# Patient Record
Sex: Female | Born: 1960 | Race: Black or African American | Hispanic: No | Smoking: Never smoker
Health system: Southern US, Community
[De-identification: ages and names within clinical notes are randomized; demographics above are authoritative.]

## PROBLEM LIST (undated history)

## (undated) DIAGNOSIS — E039 Hypothyroidism, unspecified: Secondary | ICD-10-CM

## (undated) DIAGNOSIS — E7800 Pure hypercholesterolemia, unspecified: Secondary | ICD-10-CM

## (undated) DIAGNOSIS — I10 Essential (primary) hypertension: Secondary | ICD-10-CM

## (undated) HISTORY — PX: ABDOMINAL HYSTERECTOMY: SHX81

## (undated) HISTORY — PX: MYOMECTOMY: SHX85

---

## 1998-02-22 ENCOUNTER — Encounter: Payer: Self-pay | Admitting: Emergency Medicine

## 1998-02-22 ENCOUNTER — Emergency Department (HOSPITAL_COMMUNITY): Admission: EM | Admit: 1998-02-22 | Discharge: 1998-02-22 | Payer: Self-pay | Admitting: Emergency Medicine

## 1999-09-26 ENCOUNTER — Emergency Department (HOSPITAL_COMMUNITY): Admission: EM | Admit: 1999-09-26 | Discharge: 1999-09-26 | Payer: Self-pay | Admitting: Emergency Medicine

## 2000-07-14 ENCOUNTER — Emergency Department (HOSPITAL_COMMUNITY): Admission: EM | Admit: 2000-07-14 | Discharge: 2000-07-14 | Payer: Self-pay | Admitting: Emergency Medicine

## 2000-07-14 ENCOUNTER — Encounter: Payer: Self-pay | Admitting: Emergency Medicine

## 2006-02-14 ENCOUNTER — Ambulatory Visit: Payer: Self-pay | Admitting: Gynecology

## 2010-02-11 ENCOUNTER — Encounter: Payer: Self-pay | Admitting: Thoracic Surgery

## 2013-02-15 ENCOUNTER — Emergency Department (HOSPITAL_BASED_OUTPATIENT_CLINIC_OR_DEPARTMENT_OTHER)
Admission: EM | Admit: 2013-02-15 | Discharge: 2013-02-15 | Disposition: A | Discharge status: Home / Self Care | Payer: Self-pay | Attending: Emergency Medicine | Admitting: Emergency Medicine

## 2013-02-15 ENCOUNTER — Encounter (HOSPITAL_BASED_OUTPATIENT_CLINIC_OR_DEPARTMENT_OTHER): Payer: Self-pay | Admitting: Emergency Medicine

## 2013-02-15 DIAGNOSIS — I10 Essential (primary) hypertension: Secondary | ICD-10-CM | POA: Insufficient documentation

## 2013-02-15 DIAGNOSIS — E039 Hypothyroidism, unspecified: Secondary | ICD-10-CM | POA: Insufficient documentation

## 2013-02-15 DIAGNOSIS — K089 Disorder of teeth and supporting structures, unspecified: Secondary | ICD-10-CM | POA: Insufficient documentation

## 2013-02-15 DIAGNOSIS — K0889 Other specified disorders of teeth and supporting structures: Secondary | ICD-10-CM

## 2013-02-15 DIAGNOSIS — K044 Acute apical periodontitis of pulpal origin: Secondary | ICD-10-CM | POA: Insufficient documentation

## 2013-02-15 DIAGNOSIS — Z79899 Other long term (current) drug therapy: Secondary | ICD-10-CM | POA: Insufficient documentation

## 2013-02-15 DIAGNOSIS — Z791 Long term (current) use of non-steroidal anti-inflammatories (NSAID): Secondary | ICD-10-CM | POA: Insufficient documentation

## 2013-02-15 DIAGNOSIS — E7800 Pure hypercholesterolemia, unspecified: Secondary | ICD-10-CM | POA: Insufficient documentation

## 2013-02-15 DIAGNOSIS — R51 Headache: Secondary | ICD-10-CM | POA: Insufficient documentation

## 2013-02-15 DIAGNOSIS — K047 Periapical abscess without sinus: Secondary | ICD-10-CM

## 2013-02-15 DIAGNOSIS — K002 Abnormalities of size and form of teeth: Secondary | ICD-10-CM | POA: Insufficient documentation

## 2013-02-15 DIAGNOSIS — Z792 Long term (current) use of antibiotics: Secondary | ICD-10-CM | POA: Insufficient documentation

## 2013-02-15 DIAGNOSIS — K029 Dental caries, unspecified: Secondary | ICD-10-CM | POA: Insufficient documentation

## 2013-02-15 HISTORY — DX: Pure hypercholesterolemia, unspecified: E78.00

## 2013-02-15 HISTORY — DX: Hypothyroidism, unspecified: E03.9

## 2013-02-15 HISTORY — DX: Essential (primary) hypertension: I10

## 2013-02-15 MED ORDER — AMOXICILLIN 500 MG PO CAPS: 500.0000 mg | ORAL_CAPSULE | Freq: Three times a day (TID) | ORAL | Status: DC

## 2013-02-15 MED ORDER — IBUPROFEN 800 MG PO TABS: 800.0000 mg | ORAL_TABLET | Freq: Three times a day (TID) | ORAL | Status: DC

## 2013-02-15 NOTE — ED Notes (Signed)
Right upper tooth pain x 2 days.  Pt states her gums are swollen and she is having pain in jaw and right side of face.

## 2013-02-15 NOTE — ED Provider Notes (Signed)
CSN: [id number]     Arrival date & time 02/15/13  1158 History   First MD Initiated Contact with Patient 02/15/13 1214     Chief Complaint  Patient presents with  . Dental Pain  . Headache   (Consider location/radiation/quality/duration/timing/severity/associated sxs/prior Treatment) HPI Comments: Patient is a 53 year old female who presents to the emergency department complaining of right upper tooth pain x2 days. Patient states she feels like her gums are swollen. Pain radiating throughout the right side of her face, denies facial swelling. Pain worse with chewing. Denies difficulty swallowing. Denies fevers. She does not have a dentist. She has not had any alleviating factors for her symptoms.  Patient is a 53 y.o. female presenting with tooth pain and headaches. The history is provided by the patient.  Dental Pain Headache   Past Medical History  Diagnosis Date  . Hypertension   . Hypothyroid   . Hypercholesteremia    No past surgical history on file. No family history on file. History  Substance Use Topics  . Smoking status: Never Smoker   . Smokeless tobacco: Not on file  . Alcohol Use: Not on file   OB History   Grav Para Term Preterm Abortions TAB SAB Ect Mult Living                 Review of Systems  HENT: Positive for dental problem.   All other systems reviewed and are negative.    Allergies  Review of patient's allergies indicates not on file.  Home Medications   Current Outpatient Rx  Name  Route  Sig  Dispense  Refill  . albuterol (PROVENTIL HFA;VENTOLIN HFA) 108 (90 BASE) MCG/ACT inhaler   Inhalation   Inhale 2 puffs into the lungs every 6 (six) hours as needed for wheezing or shortness of breath.         Marland Kitchen amLODipine (NORVASC) 10 MG tablet   Oral   Take 10 mg by mouth daily.         . hydrochlorothiazide (HYDRODIURIL) 25 MG tablet   Oral   Take 25 mg by mouth daily.         Marland Kitchen levothyroxine (SYNTHROID, LEVOTHROID) 100 MCG tablet    Oral   Take 100 mcg by mouth daily before breakfast.         . Multiple Vitamins-Minerals (MULTIVITAMIN WITH MINERALS) tablet   Oral   Take 1 tablet by mouth daily.         . potassium chloride (K-DUR,KLOR-CON) 10 MEQ tablet   Oral   Take 10 mEq by mouth 2 (two) times daily.         . rosuvastatin (CRESTOR) 10 MG tablet   Oral   Take 10 mg by mouth daily.         Marland Kitchen amoxicillin (AMOXIL) 500 MG capsule   Oral   Take 1 capsule (500 mg total) by mouth 3 (three) times daily.   21 capsule   0   . ibuprofen (ADVIL,MOTRIN) 800 MG tablet   Oral   Take 1 tablet (800 mg total) by mouth 3 (three) times daily.   21 tablet   0    BP 138/99  Pulse 93  Temp(Src) 98.9 F (37.2 C)  Resp 16  Ht 5\' 7"  (1.702 m)  Wt 181 lb (82.101 kg)  BMI 28.34 kg/m2  SpO2 98% Physical Exam  Nursing note and vitals reviewed. Constitutional: She is oriented to person, place, and time. She appears well-developed and well-nourished. No  distress.  HENT:  Head: Normocephalic and atraumatic.  Mouth/Throat: Oropharynx is clear and moist.  Poor dentition. Multiple dental caries and tooth decay. Gingival swelling right upper. No dental abscess.  Eyes: Conjunctivae are normal.  Neck: Normal range of motion. Neck supple.  Cardiovascular: Normal rate, regular rhythm and normal heart sounds.   Pulmonary/Chest: Effort normal and breath sounds normal.  Abdominal: Soft. Bowel sounds are normal. There is no tenderness.  Musculoskeletal: Normal range of motion. She exhibits no edema.  Lymphadenopathy:    She has no cervical adenopathy.  Neurological: She is alert and oriented to person, place, and time.  Skin: Skin is warm and dry. She is not diaphoretic.  Psychiatric: She has a normal mood and affect. Her behavior is normal.    ED Course  Procedures (including critical care time) Labs Review Labs Reviewed - No data to display Imaging Review No results found.  EKG Interpretation   None        MDM   1. Pain, dental   2. Dental infection     Dental pain associated with dental infection. No evidence of dental abscess. Patient is afebrile, non toxic appearing and swallowing secretions well. I gave patient referral to dentist and stressed the importance of dental follow up for ultimate management of dental pain. I will also give amoxicillin and pain control. Patient voices understanding and is agreeable to plan.     Trevor MaceRobyn M Albert, PA-C 02/15/13 1249

## 2013-02-15 NOTE — ED Provider Notes (Signed)
Medical screening examination/treatment/procedure(s) were performed by non-physician practitioner and as supervising physician I was immediately available for consultation/collaboration.  EKG Interpretation   None         Charles B. Sheldon, MD 02/15/13 1324 

## 2013-02-15 NOTE — Discharge Instructions (Signed)
Take antibiotic to completion. Use ibuprofen as directed for pain. Followup with one of the resources below to find a dentist.  Emergency Department Resource Guide 1) Find a Doctor and Pay Out of Pocket Although you won't have to find out who is covered by your insurance plan, it is a good idea to ask around and get recommendations. You will then need to call the office and see if the doctor you have chosen will accept you as a new patient and what types of options they offer for patients who are self-pay. Some doctors offer discounts or will set up payment plans for their patients who do not have insurance, but you will need to ask so you aren't surprised when you get to your appointment.  2) Contact Your Local Health Department Not all health departments have doctors that can see patients for sick visits, but many do, so it is worth a call to see if yours does. If you don't know where your local health department is, you can check in your phone book. The CDC also has a tool to help you locate your state's health department, and many state websites also have listings of all of their local health departments.  3) Find a Walk-in Clinic If your illness is not likely to be very severe or complicated, you may want to try a walk in clinic. These are popping up all over the country in pharmacies, drugstores, and shopping centers. They're usually staffed by nurse practitioners or physician assistants that have been trained to treat common illnesses and complaints. They're usually fairly quick and inexpensive. However, if you have serious medical issues or chronic medical problems, these are probably not your best option.  No Primary Care Doctor: - Call Health Connect at  [phone] - they can help you locate a primary care doctor that  accepts your insurance, provides certain services, etc. - Physician Referral Service- [phone]  Chronic Pain Problems: Organization         Address  Phone    Notes  [facility]  [phone] Patients need to be referred by their primary care doctor.   Medication Assistance: Organization         Address  Phone   Notes  [facility] Medication [facility] [address] [place] [place], [place] [address] [phone] --Must be a resident of [facility] -- Must have NO insurance coverage whatsoever (no Medicaid/ Medicare, etc.) -- The pt. MUST have a primary care doctor that directs their care regularly and follows them in the community   [facility]  [phone]   [employer]  [phone]    Agencies that provide inexpensive medical care: Organization         Address  Phone   Notes  [facility]  [phone]   [facility]    [phone]   [facility] [address] [place], [place] [address] [phone]   [facility] [address] [place] [place] [phone]   Planned Parenthood    [phone]   [facility]    [phone]   [facility]  [address], [place] Phone:  [phone], Fax:  [phone] Hours of Operation:  9 am - 6 pm, M-F.  Also accepts Medicaid/Medicare and self-pay.  [facility]  [address],  Phone: [phone], Fax: [phone])  [phone]. Hours of Operation:  8:30 am - 5:30 pm, M-F.  Also accepts Medicaid and self-pay.  [facility] [place] [address], [place] Phone: [phone]   [place], [place], [place] [phone], Ext. [phone] Mondays & Thursdays: 7-9 AM.  First 15 patients are seen on a first come, first serve basis.    [place] Providers:  Organization         Address  Phone   Notes  [facility] [address], [place] [phone] Also accepts self-pay patients.  [facility]  [address] [place], [place]  [phone]   [place], [address], [place] [phone]   [facility] [address] [place] [phone]   Lucianne Lei [address], [place]   [phone] Only accepts [place] Access [place] patients after they have their name applied to their card.   Self-Pay (no insurance) in [facility]:  Organization         Address  Phone   Notes  Sickle Cell Patients, [facility] Internal Medicine [place] [phone]   [facility] [place] [phone]   [facility] [place]  [place], [place], [place] [phone]   [facility]/Dr. Osei-Bonsu  [address] [place] or [address], [place] [phone] Phone number for both [place] and [place] locations is the same.  [facility] [address] [place] [phone]   [facility] [address], [place] or [address] [phone]   [facility] [place] [address] [place] [phone], phone; [phone], fax Sees patients 1st and 3rd Saturday of every month.  Must not qualify for public or private insurance (i.e. Medicaid, Medicare, [place] Health Choice, Veterans' Benefits)  Household income should be no more than 200% of the poverty level The clinic cannot treat you if you are pregnant or think you are pregnant  Sexually transmitted diseases are not treated at the clinic.    Dental Care: Organization         Address  Phone  Notes  [facility] Department of [facility] [phone] Accepts children up to age 36 who are enrolled in [place] or [place]; pregnant women with a Medicaid card; and children who have  applied for Medicaid or [place] Health Choice, but were declined, whose parents can pay a reduced fee at time of service.  [facility] Department of [facility]  [address], [place] [phone] Accepts children up to age 21 who are enrolled in [place] or [place]; pregnant women with a Medicaid card; and children who have applied for Medicaid or [place] Health Choice, but were declined, whose parents can pay a reduced fee at time of service.  [place] [facility] PRO[facility]  [place] [phone] Patients are seen by appointment only. Walk-ins are not accepted. [place] will see patients 59 years of age and older. Monday - Tuesday (8am-5pm) Most Wednesdays (8:30-5pm) $30 per visit, cash only  [facility] Adult [employer] PROGRAM  [address], [place] [phone]  Patients are seen by appointment only. Walk-ins are not accepted. [place] will see patients 50 years of age and older. One Wednesday Evening (Monthly: Volunteer Based).  $30 per visit, cash only  [place]  [phone] for adults; Children under age 11, call Graduate Pediatric Dentistry at [phone]. Children aged 18-14, please call [phone] to request a pediatric application.  Dental services are provided in all areas of dental care including fillings, crowns and bridges, complete and partial dentures, implants, gum treatment, root canals, and extractions. Preventive care is also provided. Treatment is provided to both adults and children. Patients are selected via a lottery and there is often a waiting list.   [facility] [address], [place]  [phone] [email or web address]   [facility] [address] [place], [place] [phone], Ext. [phone] Second and Fourth Thursday of each month, opens at 6:30 AM; Clinic ends at 9 AM.  Patients are seen on a first-come first-served basis, and a  limited number are seen during each clinic.   [facility]  [address] Hillard Danker [place], [place] [phone]   Eligibility Requirements You must have lived in [place], [place], or [place] counties for at least the last three months.   You cannot be eligible for state or federal sponsored [employer], including [employer], [place], or [employer].   You generally cannot be eligible for healthcare insurance through your employer.    How to apply: Eligibility screenings are held every Tuesday and Wednesday afternoon from 1:00 pm until 4:00 pm. You do not need an appointment for the interview!  [facility] [address], [place], [place]   [place] Department  [place]  [phone]    Behavioral Health Resources in the Community: Intensive Outpatient Programs Organization         Address  Phone  Notes  Hyden Girard. [address], [place], [place] [phone]   [facility]patient [address], [place], [place]   [facility]: [facility] [address], [place], [place]   [place] [address]  [place], [place] or [phone]   Substance Abuse Resources Organization         Address  Phone  Notes  Alcohol and Drug Services  [phone]   [place]  [phone]   [facility] [place]   Chinita Pester  [phone]   Residential & Outpatient Substance Abuse Program  [phone]   Psychological Services Organization         Address  Phone  Notes  [facility] [place]  [place]  [phone]   [place] [address] [place] or [phone]    Mobile Crisis Teams Organization          Address  Phone  Notes  [facility], Mobile Crisis Care Unit  [phone]   [facility]  [address] [place], [place]   [place] [address] [place] [phone]    Self-Help/Support Groups Organization         Address  Phone             Notes  [place]. of [facility] - variety of support groups  [place]  Call for more information  Narcotics Anonymous (NA), [facility] [address], [place]  2 meetings at this location   Residential Sports administratorTreatment Programs Organization         Address  Phone  Notes  [facility] [address],    [place]  [phone]   [facility]  [address], [place] [phone], [place], [place] [phone]   [facility] [address], [place] [phone] Admissions: 8am-3pm M-F  [facility] [address]    [place], [place] [phone]   [facility] [address], [place], [place] [phone]   [facility] [address]  [place], [place] [phone]   [facility] - Intensive Outpatient [address], [place], [place] [phone]   [facility] [address]  [place], [place] [phone] or [phone]   [facility] (RTS) [address] [place], [place] [phone] Accepts Medicaid  [facility] [address]  [place] [phone] Substance Abuse/Addiction Treatment   [facility] Behavioral Health Resources Organization         Address  Phone  Notes  [facility]  [phone](888) [phone]   Angie FavaJulie Brannon, PhD [address] [place], [place]   [phone]) [phone] or [phone]) [phone]   [facility]   [address] [place], [place] [phone]) [phone]   [facility] [address], [place], [place] [phone](336) [phone] Insurance/Medicaid/sponsorship  through [facility]  [facility] Families [address]                                    [place], [place] [phone]) [phone] Therapy/tele-psych/case  [facility] [address].   [place], [place] [phone]) [phone]    Dr. Lolly MustacheArfeen  [phone]   [facility]  [employer] [facility] 1) [address], [place] 2) [address], [place] 3)  [address], [place] [phone]) [phone](336) [phone]  [phone]) [phone]   [facility] Child Abuse Hotline [phone](336) [phone] or [phone](336) [phone] (After Hours)       Dental Pain Toothache is pain in or around a tooth. It may get worse with chewing or with cold or heat.  HOME CARE  Your dentist may use a numbing medicine during treatment. If so, you may need to avoid eating until the medicine wears off. Ask your dentist about this.  Only take medicine as told by your dentist or doctor.  Avoid chewing food near the painful tooth until after all treatment is done. Ask your dentist about this. GET HELP RIGHT AWAY IF:   The problem gets worse or new problems appear.  You have a fever.  There is redness and puffiness (swelling) of the face, jaw, or neck.  You cannot open your mouth.  There is pain in the jaw.  There is very bad pain that is not helped by medicine. MAKE SURE YOU:   Understand these instructions.  Will watch your condition.  Will get help right away if you are not doing well or get worse. Document Released: 06/27/2007 Document Revised: 04/02/2011 Document Reviewed: 06/27/2007 [facility] Patient Information 2014 [place], [place].

## 2014-08-19 ENCOUNTER — Encounter (HOSPITAL_BASED_OUTPATIENT_CLINIC_OR_DEPARTMENT_OTHER): Payer: Self-pay

## 2014-08-19 ENCOUNTER — Emergency Department (HOSPITAL_BASED_OUTPATIENT_CLINIC_OR_DEPARTMENT_OTHER): Payer: Self-pay

## 2014-08-19 ENCOUNTER — Emergency Department (HOSPITAL_BASED_OUTPATIENT_CLINIC_OR_DEPARTMENT_OTHER)
Admission: EM | Admit: 2014-08-19 | Discharge: 2014-08-19 | Disposition: A | Discharge status: Home / Self Care | Payer: Self-pay | Attending: Emergency Medicine | Admitting: Emergency Medicine

## 2014-08-19 DIAGNOSIS — J069 Acute upper respiratory infection, unspecified: Secondary | ICD-10-CM | POA: Insufficient documentation

## 2014-08-19 DIAGNOSIS — E039 Hypothyroidism, unspecified: Secondary | ICD-10-CM | POA: Insufficient documentation

## 2014-08-19 DIAGNOSIS — I10 Essential (primary) hypertension: Secondary | ICD-10-CM | POA: Insufficient documentation

## 2014-08-19 DIAGNOSIS — E780 Pure hypercholesterolemia: Secondary | ICD-10-CM | POA: Insufficient documentation

## 2014-08-19 DIAGNOSIS — Z79899 Other long term (current) drug therapy: Secondary | ICD-10-CM | POA: Insufficient documentation

## 2014-08-19 MED ORDER — AZITHROMYCIN 250 MG PO TABS: ORAL_TABLET | ORAL | Status: DC

## 2014-08-19 NOTE — Discharge Instructions (Signed)
Continue over-the-counter medications as needed for cough or congestion.  Fill the prescription for Zithromax if you're not improving in the next 48 hours.   Upper Respiratory Infection, Adult An upper respiratory infection (URI) is also sometimes known as the common cold. The upper respiratory tract includes the nose, sinuses, throat, trachea, and bronchi. Bronchi are the airways leading to the lungs. Most people improve within 1 week, but symptoms can last up to 2 weeks. A residual cough may last even longer.  CAUSES Many different viruses can infect the tissues lining the upper respiratory tract. The tissues become irritated and inflamed and often become very moist. Mucus production is also common. A cold is contagious. You can easily spread the virus to others by oral contact. This includes kissing, sharing a glass, coughing, or sneezing. Touching your mouth or nose and then touching a surface, which is then touched by another person, can also spread the virus. SYMPTOMS  Symptoms typically develop 1 to 3 days after you come in contact with a cold virus. Symptoms vary from person to person. They may include:  Runny nose.  Sneezing.  Nasal congestion.  Sinus irritation.  Sore throat.  Loss of voice (laryngitis).  Cough.  Fatigue.  Muscle aches.  Loss of appetite.  Headache.  Low-grade fever. DIAGNOSIS  You might diagnose your own cold based on familiar symptoms, since most people get a cold 2 to 3 times a year. Your caregiver can confirm this based on your exam. Most importantly, your caregiver can check that your symptoms are not due to another disease such as strep throat, sinusitis, pneumonia, asthma, or epiglottitis. Blood tests, throat tests, and X-rays are not necessary to diagnose a common cold, but they may sometimes be helpful in excluding other more serious diseases. Your caregiver will decide if any further tests are required. RISKS AND COMPLICATIONS  You may be at  risk for a more severe case of the common cold if you smoke cigarettes, have chronic heart disease (such as heart failure) or lung disease (such as asthma), or if you have a weakened immune system. The very young and very old are also at risk for more serious infections. Bacterial sinusitis, middle ear infections, and bacterial pneumonia can complicate the common cold. The common cold can worsen asthma and chronic obstructive pulmonary disease (COPD). Sometimes, these complications can require emergency medical care and may be life-threatening. PREVENTION  The best way to protect against getting a cold is to practice good hygiene. Avoid oral or hand contact with people with cold symptoms. Wash your hands often if contact occurs. There is no clear evidence that vitamin C, vitamin E, echinacea, or exercise reduces the chance of developing a cold. However, it is always recommended to get plenty of rest and practice good nutrition. TREATMENT  Treatment is directed at relieving symptoms. There is no cure. Antibiotics are not effective, because the infection is caused by a virus, not by bacteria. Treatment may include:  Increased fluid intake. Sports drinks offer valuable electrolytes, sugars, and fluids.  Breathing heated mist or steam (vaporizer or shower).  Eating chicken soup or other clear broths, and maintaining good nutrition.  Getting plenty of rest.  Using gargles or lozenges for comfort.  Controlling fevers with ibuprofen or acetaminophen as directed by your caregiver.  Increasing usage of your inhaler if you have asthma. Zinc gel and zinc lozenges, taken in the first 24 hours of the common cold, can shorten the duration and lessen the severity of symptoms.  Pain medicines may help with fever, muscle aches, and throat pain. A variety of non-prescription medicines are available to treat congestion and runny nose. Your caregiver can make recommendations and may suggest nasal or lung inhalers for  other symptoms.  HOME CARE INSTRUCTIONS   Only take over-the-counter or prescription medicines for pain, discomfort, or fever as directed by your caregiver.  Use a warm mist humidifier or inhale steam from a shower to increase air moisture. This may keep secretions moist and make it easier to breathe.  Drink enough water and fluids to keep your urine clear or pale yellow.  Rest as needed.  Return to work when your temperature has returned to normal or as your caregiver advises. You may need to stay home longer to avoid infecting others. You can also use a face mask and careful hand washing to prevent spread of the virus. SEEK MEDICAL CARE IF:   After the first few days, you feel you are getting worse rather than better.  You need your caregiver's advice about medicines to control symptoms.  You develop chills, worsening shortness of breath, or brown or red sputum. These may be signs of pneumonia.  You develop yellow or brown nasal discharge or pain in the face, especially when you bend forward. These may be signs of sinusitis.  You develop a fever, swollen neck glands, pain with swallowing, or white areas in the back of your throat. These may be signs of strep throat. SEEK IMMEDIATE MEDICAL CARE IF:   You have a fever.  You develop severe or persistent headache, ear pain, sinus pain, or chest pain.  You develop wheezing, a prolonged cough, cough up blood, or have a change in your usual mucus (if you have chronic lung disease).  You develop sore muscles or a stiff neck. Document Released: 07/04/2000 Document Revised: 04/02/2011 Document Reviewed: 04/15/2013 [facility] Patient Information 2015 [place], [place]. This information is not intended to replace advice given to you by your health care provider. Make sure you discuss any questions you have with your health care provider.

## 2014-08-19 NOTE — ED Provider Notes (Signed)
CSN: [id number]     Arrival date & time 08/19/14  0226 History   First MD Initiated Contact with Patient 08/19/14 0236     Chief Complaint  Patient presents with  . Cough     (Consider location/radiation/quality/duration/timing/severity/associated sxs/prior Treatment) Patient is a 54 y.o. female presenting with cough. The history is provided by the patient.  Cough Cough characteristics:  Productive Sputum characteristics:  White Severity:  Moderate Onset quality:  Sudden Duration:  1 week Timing:  Constant Progression:  Worsening Chronicity:  New Smoker: no   Relieved by:  Nothing Worsened by:  Nothing tried Ineffective treatments:  None tried Associated symptoms: chills, fever, headaches, sinus congestion and sore throat     Past Medical History  Diagnosis Date  . Hypertension   . Hypothyroid   . Hypercholesteremia    Past Surgical History  Procedure Laterality Date  . Abdominal hysterectomy     History reviewed. No pertinent family history. History  Substance Use Topics  . Smoking status: Never Smoker   . Smokeless tobacco: Not on file  . Alcohol Use: No   OB History    No data available     Review of Systems  Constitutional: Positive for fever and chills.  HENT: Positive for sore throat.   Respiratory: Positive for cough.   Neurological: Positive for headaches.  All other systems reviewed and are negative.     Allergies  Review of patient's allergies indicates no known allergies.  Home Medications   Prior to Admission medications   Medication Sig Start Date End Date Taking? Authorizing Provider  albuterol (PROVENTIL HFA;VENTOLIN HFA) 108 (90 BASE) MCG/ACT inhaler Inhale 2 puffs into the lungs every 6 (six) hours as needed for wheezing or shortness of breath.   Yes Historical Provider, MD  amLODipine (NORVASC) 10 MG tablet Take 10 mg by mouth daily.   Yes Historical Provider, MD  hydrochlorothiazide (HYDRODIURIL) 25 MG tablet Take 25 mg by mouth  daily.   Yes Historical Provider, MD  ibuprofen (ADVIL,MOTRIN) 800 MG tablet Take 1 tablet (800 mg total) by mouth 3 (three) times daily. 02/15/13  Yes Robyn M Hess, PA-C  levothyroxine (SYNTHROID, LEVOTHROID) 100 MCG tablet Take 100 mcg by mouth daily before breakfast.   Yes Historical Provider, MD  Multiple Vitamins-Minerals (MULTIVITAMIN WITH MINERALS) tablet Take 1 tablet by mouth daily.   Yes Historical Provider, MD  potassium chloride (K-DUR,KLOR-CON) 10 MEQ tablet Take 10 mEq by mouth 2 (two) times daily.   Yes Historical Provider, MD  rosuvastatin (CRESTOR) 10 MG tablet Take 10 mg by mouth daily.   Yes Historical Provider, MD  amoxicillin (AMOXIL) 500 MG capsule Take 1 capsule (500 mg total) by mouth 3 (three) times daily. 02/15/13   Robyn M Hess, PA-C   BP 100/72 mmHg  Pulse 96  Temp(Src) 99.4 F (37.4 C) (Oral)  Resp 20  Ht  (1.702 m)  Wt 174 lb (78.926 kg)  BMI 27.25 kg/m2  SpO2 98% Physical Exam  Constitutional: She is oriented to person, place, and time. She appears well-developed and well-nourished. No distress.  HENT:  Head: Normocephalic and atraumatic.  Mouth/Throat: Oropharynx is clear and moist.  There is frontal and maxillary sinus tenderness.  Neck: Normal range of motion. Neck supple.  Cardiovascular: Normal rate and regular rhythm.  Exam reveals no gallop and no friction rub.   No murmur heard. Pulmonary/Chest: Effort normal and breath sounds normal. No respiratory distress. She has no wheezes. She has no rales.  Abdominal:  Soft. Bowel sounds are normal. She exhibits no distension. There is no tenderness.  Musculoskeletal: Normal range of motion.  Lymphadenopathy:    She has no cervical adenopathy.  Neurological: She is alert and oriented to person, place, and time.  Skin: Skin is warm and dry. She is not diaphoretic.  Nursing note and vitals reviewed.   ED Course  Procedures (including critical care time) Labs Review Labs Reviewed - No data to  display  Imaging Review No results found.   EKG Interpretation None      MDM   Final diagnoses:  None    Chest x-ray is negative for pneumonia. Patient is having worsening symptoms despite 1 week of symptomatic treatment. Symptoms may likely be viral, so I will advise her to wait 2 days prior to filling the prescription for Zithromax she was given this evening. If she has not improving he can fill the prescription at that time.     Geoffery Lyons, MD 08/19/14 [phone]

## 2014-08-19 NOTE — ED Notes (Signed)
Pt reports one week history of fever, headache, productive cough with clear sputum, and nausea.

## 2014-08-28 ENCOUNTER — Other Ambulatory Visit: Payer: Self-pay | Admitting: Nurse Practitioner

## 2016-10-22 ENCOUNTER — Emergency Department (HOSPITAL_BASED_OUTPATIENT_CLINIC_OR_DEPARTMENT_OTHER)
Admission: EM | Admit: 2016-10-22 | Discharge: 2016-10-22 | Disposition: A | Discharge status: Home / Self Care | Payer: Self-pay | Attending: Emergency Medicine | Admitting: Emergency Medicine

## 2016-10-22 ENCOUNTER — Encounter (HOSPITAL_BASED_OUTPATIENT_CLINIC_OR_DEPARTMENT_OTHER): Payer: Self-pay | Admitting: Emergency Medicine

## 2016-10-22 DIAGNOSIS — B9789 Other viral agents as the cause of diseases classified elsewhere: Secondary | ICD-10-CM

## 2016-10-22 DIAGNOSIS — E039 Hypothyroidism, unspecified: Secondary | ICD-10-CM | POA: Insufficient documentation

## 2016-10-22 DIAGNOSIS — J069 Acute upper respiratory infection, unspecified: Secondary | ICD-10-CM | POA: Insufficient documentation

## 2016-10-22 DIAGNOSIS — R05 Cough: Secondary | ICD-10-CM | POA: Insufficient documentation

## 2016-10-22 DIAGNOSIS — Z79899 Other long term (current) drug therapy: Secondary | ICD-10-CM | POA: Insufficient documentation

## 2016-10-22 DIAGNOSIS — I10 Essential (primary) hypertension: Secondary | ICD-10-CM | POA: Insufficient documentation

## 2016-10-22 LAB — RAPID STREP SCREEN (MED CTR MEBANE ONLY): Streptococcus, Group A Screen (Direct): NEGATIVE

## 2016-10-22 MED ORDER — ACETAMINOPHEN 500 MG PO TABS
500.0000 mg | ORAL_TABLET | Freq: Once | ORAL | Status: AC
  Administered 2016-10-22: 500 mg via ORAL
  Filled 2016-10-22: qty 1

## 2016-10-22 NOTE — ED Provider Notes (Signed)
[facility] Provider Note   CSN: [id number] Arrival date & time: 10/22/16  0931     History   Chief Complaint Chief Complaint  Patient presents with  . Sore Throat    HPI Cheryl Stephenson is a 56 y.o. female past medical history of hypertension, hypothyroid, presented to the ED for acute onset of sore throat that began on Thursday. Patient states she was seen at [facility] ER for similar complaint on Friday, and was diagnosed with viral infection. Per chart review, chest x-ray was done and negative. She states throat is scratchy with associated mild discomfort upon swallowing. She reports associated nasal congestion, cough, and subjective fever. She denies difficulty breathing or swallowing, chest pain, shortness of breath, abdominal pain, ear pain, or any other complaints. She states she has been taking Tylenol with some relief of symptoms.  The history is provided by the patient.    Past Medical History:  Diagnosis Date  . Hypercholesteremia   . Hypertension   . Hypothyroid     There are no active problems to display for this patient.   Past Surgical History:  Procedure Laterality Date  . ABDOMINAL HYSTERECTOMY    . MYOMECTOMY      OB History    No data available       Home Medications    Prior to Admission medications   Medication Sig Start Date End Date Taking? Authorizing Provider  albuterol (PROVENTIL HFA;VENTOLIN HFA) 108 (90 BASE) MCG/ACT inhaler Inhale 2 puffs into the lungs every 6 (six) hours as needed for wheezing or shortness of breath.    [provider]  amLODipine (NORVASC) 10 MG tablet Take 5 mg by mouth daily.     [provider]  amoxicillin (AMOXIL) 500 MG capsule Take 1 capsule (500 mg total) by mouth 3 (three) times daily. 02/15/13   Hess, Nada Boozer, PA-C  azithromycin (ZITHROMAX Z-PAK) 250 MG tablet 2 po day one, then 1 daily x 4 days 08/19/14   Geoffery Lyons, MD  hydrochlorothiazide (HYDRODIURIL) 25 MG tablet Take 25  mg by mouth daily.    [provider]  ibuprofen (ADVIL,MOTRIN) 800 MG tablet Take 1 tablet (800 mg total) by mouth 3 (three) times daily. 02/15/13   Hess, Nada Boozer, PA-C  levothyroxine (SYNTHROID, LEVOTHROID) 100 MCG tablet Take 100 mcg by mouth daily before breakfast.    [provider]  Multiple Vitamins-Minerals (MULTIVITAMIN WITH MINERALS) tablet Take 1 tablet by mouth daily.    [provider]  potassium chloride (K-DUR,KLOR-CON) 10 MEQ tablet Take 10 mEq by mouth 2 (two) times daily.    [provider]  rosuvastatin (CRESTOR) 10 MG tablet Take 10 mg by mouth daily.    [provider]    Family History No family history on file.  Social History Social History  Substance Use Topics  . Smoking status: Never Smoker  . Smokeless tobacco: Never Used  . Alcohol use No     Allergies   Patient has no known allergies.   Review of Systems Review of Systems  Constitutional: Positive for fever (Subjective).  HENT: Positive for congestion and sore throat. Negative for ear pain, trouble swallowing and voice change.   Respiratory: Positive for cough. Negative for shortness of breath.   Cardiovascular: Negative for chest pain.  Gastrointestinal: Negative for abdominal pain.     Physical Exam Updated Vital Signs BP 106/80 (BP Location: Right Arm)   Pulse 82   Temp 98.1 F (36.7 C) (Oral)  Resp 18   Ht  (1.702 m)   Wt 71.2 kg (157 lb)   SpO2 97%   BMI 24.59 kg/m   Physical Exam  Constitutional: She appears well-developed and well-nourished. No distress.  Tolerating secretions  HENT:  Head: Normocephalic and atraumatic.  Nose: Nose normal.  Mouth/Throat: Uvula is midline. No trismus in the jaw. Abnormal dentition (Poor). No uvula swelling. No posterior oropharyngeal edema.  Unable to visualize bilateral TMs secondary to cerumen. Mild oropharyngeal erythema, without exudates or edema.  Eyes: Conjunctivae are normal.  Neck:  Normal range of motion. Neck supple.  Cardiovascular: Normal rate, regular rhythm, normal heart sounds and intact distal pulses.   Pulmonary/Chest: Effort normal and breath sounds normal. No respiratory distress. She has no wheezes. She has no rales.  Abdominal: Soft. Bowel sounds are normal. She exhibits no distension. There is no tenderness.  Lymphadenopathy:    She has no cervical adenopathy.  Psychiatric: She has a normal mood and affect. Her behavior is normal.  Nursing note and vitals reviewed.    ED Treatments / Results  Labs (all labs ordered are listed, but only abnormal results are displayed) Labs Reviewed  RAPID STREP SCREEN (NOT AT [facility])  CULTURE, GROUP A STREP [facility])    EKG  EKG Interpretation None       Radiology No results found.  Procedures Procedures (including critical care time)  Medications Ordered in ED Medications  acetaminophen (TYLENOL) tablet 500 mg (not administered)     Initial Impression / Assessment and Plan / ED Course  I have reviewed the triage vital signs and the nursing notes.  Pertinent labs & imaging results that were available during my care of the patient were reviewed by me and considered in my medical decision making (see chart for details).     Patient presenting with symptoms consistent with URI, likely viral etiology. Pt seen at [facility] regional on Friday with CXR negative for acute infiltrate. Strep swab negative today. Tolerating secretions, no oropharyngeal exudate or edema,  lungs clear. Discussed that antibiotics are not indicated for viral infections. Pt will be discharged with symptomatic treatment.  Verbalizes understanding and is agreeable with plan.  Recommended follow up with PCP. Pt is hemodynamically stable & in NAD prior to dc.  Discussed results, findings, treatment and follow up. Patient advised of return precautions. Patient verbalized understanding and agreed with plan.  Final Clinical Impressions(s) / ED  Diagnoses   Final diagnoses:  Viral URI with cough    New Prescriptions New Prescriptions   No medications on file     Russo, [place] N, PA-C 10/22/16 1043    Benjiman Core, MD 10/23/16 1537

## 2016-10-22 NOTE — Discharge Instructions (Signed)
Please read instructions below. You can take tylenol as needed for sore throat or body aches. Drink plenty of water. Follow up with your primary care provider as needed. Return to the ER for difficulty swallowing liquids, difficulty breathing, or new or worsening symptoms. ° °

## 2016-10-22 NOTE — ED Triage Notes (Signed)
Pt c/o sore throat since Thur; also notices yellow mucous when clearing throat

## 2016-10-24 LAB — CULTURE, GROUP A STREP (THRC)

## 2017-03-06 ENCOUNTER — Other Ambulatory Visit: Payer: Self-pay

## 2017-03-06 ENCOUNTER — Encounter (HOSPITAL_BASED_OUTPATIENT_CLINIC_OR_DEPARTMENT_OTHER): Payer: Self-pay

## 2017-03-06 ENCOUNTER — Emergency Department (HOSPITAL_BASED_OUTPATIENT_CLINIC_OR_DEPARTMENT_OTHER)
Admission: EM | Admit: 2017-03-06 | Discharge: 2017-03-06 | Disposition: A | Discharge status: Home / Self Care | Payer: Self-pay | Attending: Emergency Medicine | Admitting: Emergency Medicine

## 2017-03-06 DIAGNOSIS — E039 Hypothyroidism, unspecified: Secondary | ICD-10-CM | POA: Insufficient documentation

## 2017-03-06 DIAGNOSIS — I10 Essential (primary) hypertension: Secondary | ICD-10-CM | POA: Insufficient documentation

## 2017-03-06 DIAGNOSIS — J029 Acute pharyngitis, unspecified: Secondary | ICD-10-CM

## 2017-03-06 DIAGNOSIS — R070 Pain in throat: Secondary | ICD-10-CM | POA: Insufficient documentation

## 2017-03-06 DIAGNOSIS — Z79899 Other long term (current) drug therapy: Secondary | ICD-10-CM | POA: Insufficient documentation

## 2017-03-06 LAB — RAPID STREP SCREEN (MED CTR MEBANE ONLY): STREPTOCOCCUS, GROUP A SCREEN (DIRECT): NEGATIVE

## 2017-03-06 NOTE — ED Triage Notes (Signed)
C/o flu like sx x 1 week-NAD-steady gait 

## 2017-03-06 NOTE — Discharge Instructions (Signed)
Please read and follow all provided instructions.  Your diagnoses today include:  1. Sore throat    Tests performed today include:  Vital signs. See below for your results today.   Strep test - I will call if positive  Medications prescribed:   None  Take any prescribed medications only as directed. Treatment for your infection is aimed at treating the symptoms. There are no medications, such as antibiotics, that will cure your infection.   Home care instructions:  Follow any educational materials contained in this packet.   Your illness is contagious and can be spread to others, especially during the first 3 or 4 days. It cannot be cured by antibiotics or other medicines. Take basic precautions such as washing your hands often, covering your mouth when you cough or sneeze, and avoiding public places where you could spread your illness to others.   Please continue drinking plenty of fluids.  Use over-the-counter medicines as needed as directed on packaging for symptom relief.  You may also use ibuprofen or tylenol as directed on packaging for pain or fever.  Do not take multiple medicines containing Tylenol or acetaminophen to avoid taking too much of this medication.  Follow-up instructions: Please follow-up with your primary care provider in the next 3 days for further evaluation of your symptoms if you are not feeling better.   Return instructions:   Please return to the Emergency Department if you experience worsening symptoms.   RETURN IMMEDIATELY IF you develop shortness of breath, confusion or altered mental status, a new rash, become dizzy, faint, or poorly responsive, or are unable to be cared for at home.  Please return if you have persistent vomiting and cannot keep down fluids or develop a fever that is not controlled by tylenol or motrin.    Please return if you have any other emergent concerns.  Additional Information:  Your vital signs today were: BP 129/85 (BP  Location: Left Arm)    Pulse 98    Temp 99 F (37.2 C) (Oral)    Resp 18    Ht 5\' 7"  (1.702 m)    Wt 79.2 kg (174 lb 9.6 oz)    SpO2 99%    BMI 27.35 kg/m  If your blood pressure (BP) was elevated above 135/85 this visit, please have this repeated by your doctor within one month. --------------

## 2017-03-06 NOTE — ED Provider Notes (Signed)
[facility] EMERGENCY DEPARTMENT Provider Note   CSN: [id number] Arrival date & time: 03/06/17  1343     History   Chief Complaint Chief Complaint  Patient presents with  . Cough    HPI Cheryl Stephenson is a 57 y.o. female.  Patient presents with complaint of sore throat for the past approximately 1 week.  She is concerned about strep throat because she was exposed to another adult who had strep throat.  She denies any fevers.  She has an occasional nonproductive cough.  No ear pain, runny nose.  No nausea, vomiting, diarrhea.  No chest pain.  No treatments prior to arrival.  The onset of this condition was acute. The course is constant. Aggravating factors: none. Alleviating factors: none.        Past Medical History:  Diagnosis Date  . Hypercholesteremia   . Hypertension   . Hypothyroid     There are no active problems to display for this patient.   Past Surgical History:  Procedure Laterality Date  . ABDOMINAL HYSTERECTOMY    . MYOMECTOMY      OB History    No data available       Home Medications    Prior to Admission medications   Medication Sig Start Date End Date Taking? Authorizing Provider  ATORVASTATIN CALCIUM PO Take by mouth.   Yes [provider]  amLODipine (NORVASC) 10 MG tablet Take 5 mg by mouth daily.     [provider]  hydrochlorothiazide (HYDRODIURIL) 25 MG tablet Take 25 mg by mouth daily.    [provider]  levothyroxine (SYNTHROID, LEVOTHROID) 100 MCG tablet Take 100 mcg by mouth daily before breakfast.    [provider]  Multiple Vitamins-Minerals (MULTIVITAMIN WITH MINERALS) tablet Take 1 tablet by mouth daily.    [provider]  potassium chloride (K-DUR,KLOR-CON) 10 MEQ tablet Take 10 mEq by mouth 2 (two) times daily.    [provider]    Family History No family history on file.  Social History Social History   Tobacco Use  . Smoking status: Never Smoker    . Smokeless tobacco: Never Used  Substance Use Topics  . Alcohol use: No  . Drug use: No     Allergies   Patient has no known allergies.   Review of Systems Review of Systems  Constitutional: Negative for fever.  HENT: Positive for sore throat. Negative for rhinorrhea and voice change.   Eyes: Negative for redness.  Respiratory: Positive for cough. Negative for shortness of breath.   Cardiovascular: Negative for chest pain.  Gastrointestinal: Negative for abdominal pain, diarrhea, nausea and vomiting.  Genitourinary: Negative for dysuria.  Musculoskeletal: Negative for myalgias.  Skin: Negative for rash.  Neurological: Negative for headaches.     Physical Exam Updated Vital Signs BP 129/85 (BP Location: Left Arm)   Pulse 98   Temp 99 F (37.2 C) (Oral)   Resp 18   Ht 5\' 7"  (1.702 m)   Wt 79.2 kg (174 lb 9.6 oz)   SpO2 99%   BMI 27.35 kg/m   Physical Exam  Constitutional: She appears well-developed and well-nourished.  HENT:  Head: Normocephalic and atraumatic.  Mouth/Throat: Oropharynx is clear and moist.  Eyes: Conjunctivae are normal. Right eye exhibits no discharge. Left eye exhibits no discharge.  Neck: Normal range of motion. Neck supple.  Cardiovascular: Normal rate, regular rhythm and normal heart sounds.  Pulmonary/Chest: Effort normal and breath sounds normal. No stridor. No respiratory  distress. She has no wheezes.  Abdominal: Soft. There is no tenderness.  Lymphadenopathy:    She has no cervical adenopathy.  Neurological: She is alert.  Skin: Skin is warm and dry.  Psychiatric: She has a normal mood and affect.  Nursing note and vitals reviewed.    ED Treatments / Results  Labs (all labs ordered are listed, but only abnormal results are displayed) Labs Reviewed  RAPID STREP SCREEN (NOT AT [facility])    EKG  EKG Interpretation None       Radiology No results found.  Procedures Procedures (including critical care time)  Medications  Ordered in ED Medications - No data to display   Initial Impression / Assessment and Plan / ED Course  I have reviewed the triage vital signs and the nursing notes.  Pertinent labs & imaging results that were available during my care of the patient were reviewed by me and considered in my medical decision making (see chart for details).     Patient seen and examined. Strep ordered. Patient needs to go pick up foster children.  Low clinical suspicion for strep throat, however will notify patient of positive.  Vital signs reviewed and are as follows: BP 129/85 (BP Location: Left Arm)   Pulse 98   Temp 99 F (37.2 C) (Oral)   Resp 18   Ht 5\' 7"  (1.702 m)   Wt 79.2 kg (174 lb 9.6 oz)   SpO2 99%   BMI 27.35 kg/m   Patient counseled on supportive care for viral URI and s/s to return including worsening symptoms, persistent fever, persistent vomiting, or if they have any other concerns. Urged to see PCP if symptoms persist for more than 3 days. Patient verbalizes understanding and agrees with plan.    Final Clinical Impressions(s) / ED Diagnoses   Final diagnoses:  Sore throat   Patient with sore throat, occasional cough.  Strep pending.  Patient appears well, nontoxic.  No fevers.  No voice change.  No difficulty swallowing or breathing.  Full range of motion of neck without difficulty.  I do not suspect deep space infection in the neck.  Will check for strep given recent exposure.  Otherwise conservative measures indicated with PCP follow-up if not improving.  ED Discharge Orders    None       Renne Crigler, Cordelia Poche 03/06/17 1552    Charlynne Pander, MD 03/06/17 2226

## 2017-03-09 LAB — CULTURE, GROUP A STREP (THRC)

## 2017-10-22 ENCOUNTER — Emergency Department (HOSPITAL_BASED_OUTPATIENT_CLINIC_OR_DEPARTMENT_OTHER)
Admission: EM | Admit: 2017-10-22 | Discharge: 2017-10-22 | Disposition: A | Discharge status: Home / Self Care | Payer: Self-pay | Attending: Emergency Medicine | Admitting: Emergency Medicine

## 2017-10-22 ENCOUNTER — Encounter (HOSPITAL_BASED_OUTPATIENT_CLINIC_OR_DEPARTMENT_OTHER): Payer: Self-pay

## 2017-10-22 DIAGNOSIS — I10 Essential (primary) hypertension: Secondary | ICD-10-CM | POA: Insufficient documentation

## 2017-10-22 DIAGNOSIS — Z79899 Other long term (current) drug therapy: Secondary | ICD-10-CM | POA: Insufficient documentation

## 2017-10-22 DIAGNOSIS — J020 Streptococcal pharyngitis: Secondary | ICD-10-CM | POA: Insufficient documentation

## 2017-10-22 DIAGNOSIS — J029 Acute pharyngitis, unspecified: Secondary | ICD-10-CM

## 2017-10-22 DIAGNOSIS — E039 Hypothyroidism, unspecified: Secondary | ICD-10-CM | POA: Insufficient documentation

## 2017-10-22 LAB — GROUP A STREP BY PCR: Group A Strep by PCR: NOT DETECTED

## 2017-10-22 MED ORDER — CHLORHEXIDINE GLUCONATE 0.12 % MT SOLN: 15.0000 mL | Freq: Two times a day (BID) | OROMUCOSAL | 0 refills | Status: AC

## 2017-10-22 MED ORDER — IBUPROFEN 600 MG PO TABS: 600.0000 mg | ORAL_TABLET | Freq: Four times a day (QID) | ORAL | 0 refills | Status: AC | PRN

## 2017-10-22 NOTE — ED Provider Notes (Signed)
[facility] EMERGENCY DEPARTMENT Provider Note   CSN: [id number] Arrival date & time: 10/22/17  0932     History   Chief Complaint Chief Complaint  Patient presents with  . Sore Throat    HPI Cheryl Stephenson is a 57 y.o. female.  The history is provided by the patient. No language interpreter was used.  Sore Throat      57 year old female with history of hypertension, hypothyroidism, hypercholesterolemia presenting complaining of sore throat.  Patient reports 2 days ago she developed sore throat and this morning she also endorsed throbbing frontal headache and subjective fever.  Sore throat worse with talking or with swallowing, moderate in severity.  Headache is mild.  Yesterday she experienced some mild chest discomfort lasting for less than 5 minutes in the morning but that has since resolved.  She denies any associated runny nose sneezing coughing ear pain or trouble breathing.  She tries Mucinex and Advil yesterday.  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  No complaint of abnormal weight changes, temperature sensitivity, or heart palpitation.  Past Medical History:  Diagnosis Date  . Hypercholesteremia   . Hypertension   . Hypothyroid     There are no active problems to display for this patient.   Past Surgical History:  Procedure Laterality Date  . ABDOMINAL HYSTERECTOMY    . MYOMECTOMY       OB History   None      Home Medications    Prior to Admission medications   Medication Sig Start Date End Date Taking? Authorizing Provider  amLODipine (NORVASC) 10 MG tablet Take 5 mg by mouth daily.     [provider]  ATORVASTATIN CALCIUM PO Take by mouth.    [provider]  hydrochlorothiazide (HYDRODIURIL) 25 MG tablet Take 25 mg by mouth daily.    [provider]  levothyroxine (SYNTHROID, LEVOTHROID) 100 MCG tablet Take 100 mcg by mouth daily before breakfast.    [provider]    Multiple Vitamins-Minerals (MULTIVITAMIN WITH MINERALS) tablet Take 1 tablet by mouth daily.    [provider]  potassium chloride (K-DUR,KLOR-CON) 10 MEQ tablet Take 10 mEq by mouth 2 (two) times daily.    [provider]    Family History No family history on file.  Social History Social History   Tobacco Use  . Smoking status: Never Smoker  . Smokeless tobacco: Never Used  Substance Use Topics  . Alcohol use: No  . Drug use: No     Allergies   Patient has no known allergies.   Review of Systems Review of Systems  All other systems reviewed and are negative.    Physical Exam Updated Vital Signs BP 120/87 (BP Location: Left Arm)   Pulse 97   Temp 98.4 F (36.9 C) (Oral)   Resp 16   Ht 5\' 7"  (1.702 m)   Wt 78.9 kg   SpO2 94%   BMI 27.25 kg/m   Physical Exam  Constitutional: She appears well-developed and well-nourished. No distress.  HENT:  Head: Atraumatic.  Ears: Cerumen impaction occluded both TMs Nose: Normal nares Throat: Uvula midline bilateral tonsillar enlargement without exudates, no trismus  Eyes: Conjunctivae are normal.  Exophthalmos involving both eyes  Neck: Neck supple. No thyromegaly present.  Cardiovascular: Normal rate and regular rhythm.  Pulmonary/Chest: Effort normal and breath sounds normal.  Abdominal: Soft. There is no tenderness.  Lymphadenopathy:    She has no cervical adenopathy.  Neurological: She is alert.  Skin: No rash noted.  Psychiatric: She has a normal mood and affect.  Nursing note and vitals reviewed.    ED Treatments / Results  Labs (all labs ordered are listed, but only abnormal results are displayed) Labs Reviewed  GROUP A STREP BY PCR    EKG None  Radiology No results found.  Procedures Procedures (including critical care time)  Medications Ordered in ED Medications - No data to display   Initial Impression / Assessment and Plan / ED Course  I have reviewed the triage  vital signs and the nursing notes.  Pertinent labs & imaging results that were available during my care of the patient were reviewed by me and considered in my medical decision making (see chart for details).     BP 120/87 (BP Location: Left Arm)   Pulse 97   Temp 98.4 F (36.9 C) (Oral)   Resp 16   Ht 5\' 7"  (1.702 m)   Wt 78.9 kg   SpO2 94%   BMI 27.25 kg/m    Final Clinical Impressions(s) / ED Diagnoses   Final diagnoses:  Sore throat    ED Discharge Orders         Ordered    chlorhexidine (PERIDEX) 0.12 % solution  2 times daily     10/22/17 1106    ibuprofen (ADVIL,MOTRIN) 600 MG tablet  Every 6 hours PRN     10/22/17 1106         10:21 AM Patient presents with sore throat.  She has normal phonation.  Symptoms does not suggest thyroid disease.  No evidence of deep tissue infection.  Strep test obtained.  10:59 AM Strep test is negative.  Recommend symptomatic treatment.  I also encourage patient to follow-up with her primary care doctor and have a thyroid checked.  She does have an appointment next month.  Return precautions discussed.   Fayrene Helper, PA-C 10/22/17 1108    Pricilla Loveless, MD 10/22/17 [phone]

## 2017-10-22 NOTE — ED Triage Notes (Signed)
Pt c/o sore throat x2 days, headache and fever this am

## 2017-10-29 ENCOUNTER — Encounter (HOSPITAL_BASED_OUTPATIENT_CLINIC_OR_DEPARTMENT_OTHER): Payer: Self-pay | Admitting: Emergency Medicine

## 2017-10-29 ENCOUNTER — Other Ambulatory Visit: Payer: Self-pay

## 2017-10-29 ENCOUNTER — Emergency Department (HOSPITAL_BASED_OUTPATIENT_CLINIC_OR_DEPARTMENT_OTHER): Payer: Self-pay

## 2017-10-29 ENCOUNTER — Emergency Department (HOSPITAL_BASED_OUTPATIENT_CLINIC_OR_DEPARTMENT_OTHER)
Admission: EM | Admit: 2017-10-29 | Discharge: 2017-10-29 | Disposition: A | Discharge status: Home / Self Care | Payer: Self-pay | Attending: Emergency Medicine | Admitting: Emergency Medicine

## 2017-10-29 DIAGNOSIS — B9789 Other viral agents as the cause of diseases classified elsewhere: Secondary | ICD-10-CM

## 2017-10-29 DIAGNOSIS — J069 Acute upper respiratory infection, unspecified: Secondary | ICD-10-CM | POA: Insufficient documentation

## 2017-10-29 MED ORDER — BENZONATATE 100 MG PO CAPS: 100.0000 mg | ORAL_CAPSULE | Freq: Three times a day (TID) | ORAL | 0 refills | Status: DC

## 2017-10-29 MED ORDER — FLUTICASONE PROPIONATE 50 MCG/ACT NA SUSP: 2.0000 | Freq: Every day | NASAL | 0 refills | Status: AC | PRN

## 2017-10-29 MED ORDER — BENZONATATE 100 MG PO CAPS: 100.0000 mg | ORAL_CAPSULE | Freq: Three times a day (TID) | ORAL | 0 refills | Status: AC

## 2017-10-29 MED ORDER — FLUTICASONE PROPIONATE 50 MCG/ACT NA SUSP: 2.0000 | Freq: Every day | NASAL | 0 refills | Status: DC | PRN

## 2017-10-29 NOTE — ED Triage Notes (Signed)
Pt c/o cold like symptoms for the past week not getting better with OTC medication.

## 2017-10-29 NOTE — ED Provider Notes (Signed)
[facility] EMERGENCY DEPARTMENT Provider Note   CSN: [id number] Arrival date & time: 10/29/17  4098     History   Chief Complaint Chief Complaint  Patient presents with  . Cough    HPI Cheryl Stephenson is a 57 y.o. female.  HPI   Had presented 10/1 with symptoms, still has some mild sore throat.  Cough just started this AM, thought had fever this AM.  Coughing up a little bit of phlegm, slight yellow.  Nasal congestion with yellow coming from nostril, had specks of blood on tissue. Last week was sore throat and fever, but now having congestion and cough. Slight back ache went away.  Subjective fever. No urinary symptoms    Past Medical History:  Diagnosis Date  . Hypercholesteremia   . Hypertension   . Hypothyroid     There are no active problems to display for this patient.   Past Surgical History:  Procedure Laterality Date  . ABDOMINAL HYSTERECTOMY    . MYOMECTOMY       OB History   None      Home Medications    Prior to Admission medications   Medication Sig Start Date End Date Taking? Authorizing Provider  amLODipine (NORVASC) 10 MG tablet Take 5 mg by mouth daily.     [provider]  ATORVASTATIN CALCIUM PO Take by mouth.    [provider]  benzonatate (TESSALON) 100 MG capsule Take 1 capsule (100 mg total) by mouth every 8 (eight) hours. 10/29/17   Alvira Monday, MD  chlorhexidine (PERIDEX) 0.12 % solution Use as directed 15 mLs in the mouth or throat 2 (two) times daily. 10/22/17   Fayrene Helper, PA-C  fluticasone (FLONASE) 50 MCG/ACT nasal spray Place 2 sprays into both nostrils daily as needed for up to 7 days for allergies or rhinitis. 10/29/17 11/05/17  Alvira Monday, MD  hydrochlorothiazide (HYDRODIURIL) 25 MG tablet Take 25 mg by mouth daily.    [provider]  ibuprofen (ADVIL,MOTRIN) 600 MG tablet Take 1 tablet (600 mg total) by mouth every 6 (six) hours as needed. 10/22/17   Fayrene Helper, PA-C    levothyroxine (SYNTHROID, LEVOTHROID) 100 MCG tablet Take 100 mcg by mouth daily before breakfast.    [provider]  Multiple Vitamins-Minerals (MULTIVITAMIN WITH MINERALS) tablet Take 1 tablet by mouth daily.    [provider]  potassium chloride (K-DUR,KLOR-CON) 10 MEQ tablet Take 10 mEq by mouth 2 (two) times daily.    [provider]    Family History No family history on file.  Social History Social History   Tobacco Use  . Smoking status: Never Smoker  . Smokeless tobacco: Never Used  Substance Use Topics  . Alcohol use: No  . Drug use: No     Allergies   Patient has no known allergies.   Review of Systems Review of Systems  Constitutional: Positive for fever (subjective).  HENT: Positive for congestion and sore throat.   Eyes: Negative for visual disturbance.  Respiratory: Positive for cough. Negative for shortness of breath.   Cardiovascular: Negative for chest pain.  Gastrointestinal: Negative for abdominal pain, diarrhea, nausea and vomiting.  Genitourinary: Negative for difficulty urinating and dysuria.  Musculoskeletal: Negative for back pain (did have but resolved) and neck pain.  Skin: Negative for rash.  Neurological: Negative for syncope and headaches.     Physical Exam Updated Vital Signs BP 116/83 (BP Location: Right Arm)   Pulse 85   Temp 98.7 F (  37.1 C) (Oral)   Resp 16   Ht 5\' 7"  (1.702 m)   Wt 70.3 kg   SpO2 98%   BMI 24.28 kg/m   Physical Exam  Constitutional: She is oriented to person, place, and time. She appears well-developed and well-nourished. No distress.  HENT:  Head: Normocephalic and atraumatic.  Cerumen impaction bilaterally  Eyes: Conjunctivae and EOM are normal.  Neck: Normal range of motion.  Cardiovascular: Normal rate, regular rhythm, normal heart sounds and intact distal pulses. Exam reveals no gallop and no friction rub.  No murmur heard. Pulmonary/Chest: Effort normal and breath  sounds normal. No respiratory distress. She has no wheezes. She has no rales.  Abdominal: Soft. She exhibits no distension. There is no tenderness. There is no guarding.  Musculoskeletal: She exhibits no edema or tenderness.  Neurological: She is alert and oriented to person, place, and time.  Skin: Skin is warm and dry. No rash noted. She is not diaphoretic. No erythema.  Nursing note and vitals reviewed.    ED Treatments / Results  Labs (all labs ordered are listed, but only abnormal results are displayed) Labs Reviewed - No data to display  EKG None  Radiology No results found.  Procedures Procedures (including critical care time)  Medications Ordered in ED Medications - No data to display   Initial Impression / Assessment and Plan / ED Course  I have reviewed the triage vital signs and the nursing notes.  Pertinent labs & imaging results that were available during my care of the patient were reviewed by me and considered in my medical decision making (see chart for details).     57 year old female with history above presents with concern for 1 week of sore throat, with congestion and cough beginning today.  Sore throat is improving, no signs of strep pharyngitis, PTA, RPA by history and physical exam.  Given cough present for 1 day, clear breath sounds bilaterally, no hypoxia, no tachypnea, afebrile, have low suspicion for pneumonia at this point.  Suspect most likely etiology of cough and congestion beginning today is a viral upper respiratory infection with cough.  Recommend supportive therapy.  Gave prescription for [employer] and Flonase.  Discussed reasons to return to the emergency department. Patient discharged in stable condition with understanding of reasons to return.   Final Clinical Impressions(s) / ED Diagnoses   Final diagnoses:  Viral URI with cough    ED Discharge Orders         Ordered    benzonatate (TESSALON) 100 MG capsule  Every 8 hours,    Status:  Discontinued     10/29/17 0720    fluticasone (FLONASE) 50 MCG/ACT nasal spray  Daily PRN,   Status:  Discontinued     10/29/17 0720    benzonatate (TESSALON) 100 MG capsule  Every 8 hours     10/29/17 0735    fluticasone (FLONASE) 50 MCG/ACT nasal spray  Daily PRN     10/29/17 0735           Alvira Monday, MD 10/29/17 1051

## 2018-02-25 ENCOUNTER — Other Ambulatory Visit: Payer: Self-pay

## 2018-02-25 ENCOUNTER — Encounter (HOSPITAL_BASED_OUTPATIENT_CLINIC_OR_DEPARTMENT_OTHER): Payer: Self-pay

## 2018-02-25 ENCOUNTER — Emergency Department (HOSPITAL_BASED_OUTPATIENT_CLINIC_OR_DEPARTMENT_OTHER): Payer: Self-pay

## 2018-02-25 ENCOUNTER — Emergency Department (HOSPITAL_BASED_OUTPATIENT_CLINIC_OR_DEPARTMENT_OTHER)
Admission: EM | Admit: 2018-02-25 | Discharge: 2018-02-25 | Disposition: A | Discharge status: Home / Self Care | Payer: Self-pay | Attending: Emergency Medicine | Admitting: Emergency Medicine

## 2018-02-25 DIAGNOSIS — E039 Hypothyroidism, unspecified: Secondary | ICD-10-CM | POA: Insufficient documentation

## 2018-02-25 DIAGNOSIS — R079 Chest pain, unspecified: Secondary | ICD-10-CM | POA: Insufficient documentation

## 2018-02-25 DIAGNOSIS — R0789 Other chest pain: Secondary | ICD-10-CM | POA: Insufficient documentation

## 2018-02-25 DIAGNOSIS — J111 Influenza due to unidentified influenza virus with other respiratory manifestations: Secondary | ICD-10-CM | POA: Insufficient documentation

## 2018-02-25 DIAGNOSIS — I10 Essential (primary) hypertension: Secondary | ICD-10-CM | POA: Insufficient documentation

## 2018-02-25 DIAGNOSIS — Z79899 Other long term (current) drug therapy: Secondary | ICD-10-CM | POA: Insufficient documentation

## 2018-02-25 DIAGNOSIS — Z5321 Procedure and treatment not carried out due to patient leaving prior to being seen by health care provider: Secondary | ICD-10-CM | POA: Insufficient documentation

## 2018-02-25 LAB — CBC
HCT: 35.3 % — ABNORMAL LOW (ref 36.0–46.0)
Hemoglobin: 10.5 g/dL — ABNORMAL LOW (ref 12.0–15.0)
MCH: 22.9 pg — ABNORMAL LOW (ref 26.0–34.0)
MCHC: 29.7 g/dL — ABNORMAL LOW (ref 30.0–36.0)
MCV: 77.1 fL — ABNORMAL LOW (ref 80.0–100.0)
Platelets: 225 10*3/uL (ref 150–400)
RBC: 4.58 MIL/uL (ref 3.87–5.11)
RDW: 13.2 % (ref 11.5–15.5)
WBC: 8.9 10*3/uL (ref 4.0–10.5)
nRBC: 0 % (ref 0.0–0.2)

## 2018-02-25 LAB — BASIC METABOLIC PANEL
Anion gap: 7 (ref 5–15)
BUN: 24 mg/dL — ABNORMAL HIGH (ref 6–20)
CALCIUM: 9.2 mg/dL (ref 8.9–10.3)
CO2: 27 mmol/L (ref 22–32)
Chloride: 103 mmol/L (ref 98–111)
Creatinine, Ser: 0.79 mg/dL (ref 0.44–1.00)
GFR calc non Af Amer: >60 mL/min (ref >60)
Glucose, Bld: 100 mg/dL — ABNORMAL HIGH (ref 70–99)
POTASSIUM: 3 mmol/L — AB (ref 3.5–5.1)
SODIUM: 137 mmol/L (ref 135–145)

## 2018-02-25 LAB — TROPONIN I: Troponin I: <0.03 ng/mL (ref <0.03)

## 2018-02-25 MED ORDER — SODIUM CHLORIDE 0.9% FLUSH
3.0000 mL | Freq: Once | INTRAVENOUS | Status: DC
  Filled 2018-02-25: qty 3

## 2018-02-25 NOTE — ED Triage Notes (Signed)
Pt c/o flu like symptoms for the past 2 days with cough, body ache and chills.

## 2018-02-25 NOTE — ED Triage Notes (Signed)
C/o CP x 2 days-c/o "flu like sx-HA, fever, sneezing, sore thraot" x 2 days-NAD-steady gait

## 2018-02-26 ENCOUNTER — Emergency Department (HOSPITAL_BASED_OUTPATIENT_CLINIC_OR_DEPARTMENT_OTHER)
Admission: EM | Admit: 2018-02-26 | Discharge: 2018-02-26 | Disposition: A | Discharge status: Home / Self Care | Payer: Self-pay | Attending: Emergency Medicine | Admitting: Emergency Medicine

## 2018-02-26 ENCOUNTER — Other Ambulatory Visit: Payer: Self-pay

## 2018-02-26 DIAGNOSIS — R0789 Other chest pain: Secondary | ICD-10-CM

## 2018-02-26 DIAGNOSIS — J111 Influenza due to unidentified influenza virus with other respiratory manifestations: Secondary | ICD-10-CM

## 2018-02-26 DIAGNOSIS — R69 Illness, unspecified: Secondary | ICD-10-CM

## 2018-02-26 NOTE — ED Notes (Signed)
ED Provider at bedside. 

## 2018-02-26 NOTE — Discharge Instructions (Signed)
You may alternate Tylenol 1000 mg every 6 hours as needed for fever and pain and ibuprofen 800 mg every 8 hours as needed for fever and pain. Please rest and drink plenty of fluids. This is a viral illness causing your symptoms. You do not need antibiotics for a virus. You may use over-the-counter nasal saline spray and Afrin nasal saline spray as needed for nasal congestion. Please do not use Afrin for more than 3 days in a row. You may use guaifenesin and dextromethorphan as needed for cough.  You may use lozenges and Chloraseptic spray to help with sore throat.  Warm salt water gargles can also help with sore throat.  You may use over-the-counter Unisom (doxyalamine) or Benadryl to help with sleep.  Please note that some combination medicines such as DayQuil and NyQuil have multiple medications in them.  Please make sure you look at all labels to ensure that you are not taking too much of any one particular medication.  Symptoms from a virus may take 7-14 days to run its course. ° °We do not test for the flu from the emergency department as it takes hours for this test to come back and it would not change our management. The flu is treated like any other virus with supportive measures as listed above. At this time you are outside the treatment window for Tamiflu. Tamiflu has to be taken within the first 48 hours of symptoms.  Tamiflu has many side effects including nausea, vomiting and diarrhea. ° ° ° °Steps to find a Primary Care Provider (PCP): ° °Call [phone] or [phone] to access "[facility] [place] Find a Doctor Service." ° °2.  You may also go on the [facility] website at [email or web address] ° °3.  [facility] also frequently accepts new patients. ° °[facility]  °[address] °[place] [address] °[phone] ° °4.  There are also multiple [facility], [facility] and [facility] practices throughout the [place] that  are frequently accepting new patients. You may find a clinic that is close to your home and contact them. ° °[facility] °[email or web address] °[phone] ° °[facility] °[email or web address] ° °[facility] °[email or web address] °[phone] ° °[facility] °[email or web address] °[phone] ° °5.  Local Health Departments also can provide primary care services. ° °[facility]  °[address] °[place] [address] °[phone] ° °[facility] °[address] °[place] [address] °[phone] ° °[facility] °[address]  °[place] [address] °[phone] ° ° ° ° ° ° °

## 2018-02-26 NOTE — ED Provider Notes (Signed)
TIME SEEN: 1:49 AM  CHIEF COMPLAINT: Flulike symptoms, chest pain  HPI: Patient is a 58 year old female who presents to the emergency department with 3 days of flulike symptoms.  Reports subjective fevers, chills, "stuffy in the head", sore throat, diarrhea, dry cough.  Last took Tylenol at 10 PM yesterday.  States she is also had several days of left-sided aching chest pain.  None currently.  Did not have a flu shot this year.  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  No recent travel.  No vomiting.  ROS: See HPI Constitutional:  fever  Eyes: no drainage  ENT:  runny nose   Cardiovascular:   chest pain  Resp: no SOB  GI: no vomiting GU: no dysuria Integumentary: no rash  Allergy: no hives  Musculoskeletal: no leg swelling  Neurological: no slurred speech ROS otherwise negative  PAST MEDICAL HISTORY/PAST SURGICAL HISTORY:  Past Medical History:  Diagnosis Date  . Hypercholesteremia   . Hypertension   . Hypothyroid     MEDICATIONS:  Prior to Admission medications   Medication Sig Start Date End Date Taking? Authorizing Provider  amLODipine (NORVASC) 10 MG tablet Take 5 mg by mouth daily.     [provider]  ATORVASTATIN CALCIUM PO Take by mouth.    [provider]  benzonatate (TESSALON) 100 MG capsule Take 1 capsule (100 mg total) by mouth every 8 (eight) hours. 10/29/17   Alvira MondaySchlossman, Erin, MD  chlorhexidine (PERIDEX) 0.12 % solution Use as directed 15 mLs in the mouth or throat 2 (two) times daily. 10/22/17   Fayrene Helperran, Bowie, PA-C  fluticasone (FLONASE) 50 MCG/ACT nasal spray Place 2 sprays into both nostrils daily as needed for up to 7 days for allergies or rhinitis. 10/29/17 11/05/17  Alvira MondaySchlossman, Erin, MD  hydrochlorothiazide (HYDRODIURIL) 25 MG tablet Take 25 mg by mouth daily.    [provider]  ibuprofen (ADVIL,MOTRIN) 600 MG tablet Take 1 tablet (600 mg total) by mouth every 6 (six) hours as needed. 10/22/17   Fayrene Helperran, [place], PA-C   levothyroxine (SYNTHROID, LEVOTHROID) 100 MCG tablet Take 100 mcg by mouth daily before breakfast.    [provider]  Multiple Vitamins-Minerals (MULTIVITAMIN WITH MINERALS) tablet Take 1 tablet by mouth daily.    [provider]  potassium chloride (K-DUR,KLOR-CON) 10 MEQ tablet Take 10 mEq by mouth 2 (two) times daily.    [provider]    ALLERGIES:  No Known Allergies  SOCIAL HISTORY:  Social History   Tobacco Use  . Smoking status: Never Smoker  . Smokeless tobacco: Never Used  Substance Use Topics  . Alcohol use: No    FAMILY HISTORY: No family history on file.  EXAM: BP 126/82 (BP Location: Left Arm)   Pulse 78   Temp 98.2 F (36.8 C) (Oral)   Resp 18   Ht 5\' 7"  (1.702 m)   Wt 74.4 kg   SpO2 99%   BMI 25.69 kg/m  CONSTITUTIONAL: Alert and oriented and responds appropriately to questions. Well-appearing; well-nourished HEAD: Normocephalic EYES: Conjunctivae clear, pupils appear equal, EOMI ENT: normal nose; moist mucous membranes; No pharyngeal erythema or petechiae, no tonsillar hypertrophy or exudate, no uvular deviation, no unilateral swelling, no trismus or drooling, no muffled voice, normal phonation, no stridor, no dental caries present, no drainable dental abscess noted, no Ludwig's angina, tongue sits flat in the bottom of the mouth, no angioedema, no facial erythema or warmth, no facial swelling; no pain with movement of the neck.  TMs are clear bilaterally without erythema, purulence, bulging, perforation, effusion.  No cerumen impaction or sign of foreign body in the external auditory canal. No inflammation, erythema or drainage from the external auditory canal. No signs of mastoiditis. No pain with manipulation of the pinna bilaterally.   NECK: Supple, no meningismus, no nuchal rigidity, no LAD  CARD: RRR; S1 and S2 appreciated; no murmurs, no clicks, no rubs, no gallops RESP: Normal chest excursion without splinting or tachypnea;  breath sounds clear and equal bilaterally; no wheezes, no rhonchi, no rales, no hypoxia or respiratory distress, speaking full sentences ABD/GI: Normal bowel sounds; non-distended; soft, non-tender, no rebound, no guarding, no peritoneal signs, no hepatosplenomegaly BACK:  The back appears normal and is non-tender to palpation, there is no CVA tenderness EXT: Normal ROM in all joints; non-tender to palpation; no edema; normal capillary refill; no cyanosis, no calf tenderness or swelling    SKIN: Normal color for age and race; warm; no rash NEURO: Moves all extremities equally PSYCH: The patient's mood and manner are appropriate. Grooming and personal hygiene are appropriate.  MEDICAL DECISION MAKING: Patient here with atypical chest pain.  Was in the emergency department earlier left without being seen.  At that time labs including troponin, chest x-ray were normal.  EKG then and now showed no ischemic abnormality.  No chest pain currently.  Doubt ACS, PE or dissection.  No signs of pneumonia on chest x-ray.  Does not appear volume overloaded.  Patient having flulike symptoms.  Discussed with patient that she does not need an antibiotic for viral illness.  Outside treatment window for Tamiflu.  Recommended supportive care and given instructions on medications over-the-counter she can take for symptomatic relief.  I feel she is safe to be discharged home.  She does not appear toxic, septic at this time.  Doubt meningitis, pneumonia, bacteremia.     Date: 02/26/2018 2:01 AM  Rate: 69  Rhythm: normal sinus rhythm  QRS Axis: normal  Intervals: normal  ST/T Wave abnormalities: normal  Conduction Disutrbances: none  Narrative Interpretation: unremarkable    At this time, I do not feel there is any life-threatening condition present. I have reviewed and discussed all results (EKG, imaging, lab, urine as appropriate) and exam findings with patient/family. I have reviewed nursing notes and appropriate  previous records.  I feel the patient is safe to be discharged home without further emergent workup and can continue workup as an outpatient as needed. Discussed usual and customary return precautions. Patient/family verbalize understanding and are comfortable with this plan.  Outpatient follow-up has been provided as needed. All questions have been answered.     Viggo Perko, Layla Maw, DO 02/26/18 [phone]

## 2019-09-25 ENCOUNTER — Emergency Department (HOSPITAL_BASED_OUTPATIENT_CLINIC_OR_DEPARTMENT_OTHER): Payer: Worker's Compensation

## 2019-09-25 ENCOUNTER — Emergency Department (HOSPITAL_BASED_OUTPATIENT_CLINIC_OR_DEPARTMENT_OTHER)
Admission: EM | Admit: 2019-09-25 | Discharge: 2019-09-25 | Disposition: A | Discharge status: Home / Self Care | Payer: Worker's Compensation | Attending: Emergency Medicine | Admitting: Emergency Medicine

## 2019-09-25 ENCOUNTER — Encounter (HOSPITAL_BASED_OUTPATIENT_CLINIC_OR_DEPARTMENT_OTHER): Payer: Self-pay

## 2019-09-25 ENCOUNTER — Other Ambulatory Visit: Payer: Self-pay

## 2019-09-25 DIAGNOSIS — Z79899 Other long term (current) drug therapy: Secondary | ICD-10-CM | POA: Insufficient documentation

## 2019-09-25 DIAGNOSIS — I10 Essential (primary) hypertension: Secondary | ICD-10-CM | POA: Diagnosis not present

## 2019-09-25 DIAGNOSIS — M25561 Pain in right knee: Secondary | ICD-10-CM | POA: Diagnosis not present

## 2019-09-25 DIAGNOSIS — Z79890 Hormone replacement therapy (postmenopausal): Secondary | ICD-10-CM | POA: Diagnosis not present

## 2019-09-25 DIAGNOSIS — E039 Hypothyroidism, unspecified: Secondary | ICD-10-CM | POA: Insufficient documentation

## 2019-09-25 DIAGNOSIS — W19XXXA Unspecified fall, initial encounter: Secondary | ICD-10-CM | POA: Diagnosis not present

## 2019-09-25 MED ORDER — KETOROLAC TROMETHAMINE 60 MG/2ML IM SOLN
60.0000 mg | Freq: Once | INTRAMUSCULAR | Status: DC
  Filled 2019-09-25: qty 2

## 2019-09-25 NOTE — ED Triage Notes (Signed)
Pt tripped and fell while at work and injured her R knee.

## 2019-09-25 NOTE — ED Notes (Signed)
Gave Pt. Full explanation of the toradol injection and reason for the injection but Pt. Refused the med.  Pt. Not comfortable with meds.

## 2019-09-25 NOTE — ED Provider Notes (Signed)
[facility] EMERGENCY DEPARTMENT Provider Note   CSN: [id number] Arrival date & time: 09/25/19  1651     History Chief Complaint  Patient presents with  . Fall    Cheryl Stephenson is a 59 y.o. female.  HPI 59 year old female with a history of hypercholesteremia, hypertension and hypothyroidism presents to the ER after tripping and falling onto her right knee.  Patient denies any head injury or LOC.  Reports pain to her right knee.  Has been able to walk but with pain.  Denies any significant swelling, warmth, fevers or chills.  No numbness or tingling.    Past Medical History:  Diagnosis Date  . Hypercholesteremia   . Hypertension   . Hypothyroid     There are no problems to display for this patient.   Past Surgical History:  Procedure Laterality Date  . ABDOMINAL HYSTERECTOMY    . MYOMECTOMY       OB History   No obstetric history on file.     No family history on file.  Social History   Tobacco Use  . Smoking status: Never Smoker  . Smokeless tobacco: Never Used  Vaping Use  . Vaping Use: Never used  Substance Use Topics  . Alcohol use: No  . Drug use: No    Home Medications Prior to Admission medications   Medication Sig Start Date End Date Taking? Authorizing Provider  amLODipine (NORVASC) 10 MG tablet Take 5 mg by mouth daily.     [provider]  ATORVASTATIN CALCIUM PO Take by mouth.    [provider]  benzonatate (TESSALON) 100 MG capsule Take 1 capsule (100 mg total) by mouth every 8 (eight) hours. 10/29/17   Alvira Monday, MD  chlorhexidine (PERIDEX) 0.12 % solution Use as directed 15 mLs in the mouth or throat 2 (two) times daily. 10/22/17   Fayrene Helper, PA-C  fluticasone (FLONASE) 50 MCG/ACT nasal spray Place 2 sprays into both nostrils daily as needed for up to 7 days for allergies or rhinitis. 10/29/17 11/05/17  Alvira Monday, MD  hydrochlorothiazide (HYDRODIURIL) 25 MG tablet Take 25 mg by mouth daily.     [provider]  ibuprofen (ADVIL,MOTRIN) 600 MG tablet Take 1 tablet (600 mg total) by mouth every 6 (six) hours as needed. 10/22/17   Fayrene Helper, PA-C  levothyroxine (SYNTHROID, LEVOTHROID) 100 MCG tablet Take 100 mcg by mouth daily before breakfast.    [provider]  Multiple Vitamins-Minerals (MULTIVITAMIN WITH MINERALS) tablet Take 1 tablet by mouth daily.    [provider]  potassium chloride (K-DUR,KLOR-CON) 10 MEQ tablet Take 10 mEq by mouth 2 (two) times daily.    [provider]    Allergies    Patient has no known allergies.  Review of Systems   Review of Systems  Constitutional: Negative for chills and fever.  Musculoskeletal: Positive for arthralgias.  Skin: Negative for pallor and rash.    Physical Exam Updated Vital Signs BP 117/79 (BP Location: Left Arm)   Pulse 75   Temp 99.1 F (37.3 C) (Oral)   Resp 18   Ht 5\' 7"  (1.702 m)   Wt 71.2 kg   SpO2 99%   BMI 24.59 kg/m   Physical Exam Vitals and nursing note reviewed.  Constitutional:      General: She is not in acute distress.    Appearance: Normal appearance. She is well-developed. She is not ill-appearing, toxic-appearing or diaphoretic.  HENT:     Head: Normocephalic and  atraumatic.     Mouth/Throat:     Mouth: Mucous membranes are moist.     Pharynx: Oropharynx is clear.  Eyes:     Conjunctiva/sclera: Conjunctivae normal.  Cardiovascular:     Rate and Rhythm: Normal rate and regular rhythm.     Heart sounds: No murmur heard.   Pulmonary:     Effort: Pulmonary effort is normal. No respiratory distress.     Breath sounds: Normal breath sounds.  Abdominal:     General: Abdomen is flat.     Palpations: Abdomen is soft.     Tenderness: There is no abdominal tenderness.  Musculoskeletal:        General: Tenderness present. No deformity.     Cervical back: Neck supple.     Comments: Right knee without significant swelling, erythema, warmth.  5/5 strength in  lower extremities, gross sensations intact.  Full range of motion of knee. No tibial plateu tenderness.   Skin:    General: Skin is warm and dry.     Findings: No erythema or rash.  Neurological:     General: No focal deficit present.     Mental Status: She is alert and oriented to person, place, and time.     Sensory: No sensory deficit.     Motor: No weakness.  Psychiatric:        Mood and Affect: Mood normal.        Behavior: Behavior normal.     ED Results / Procedures / Treatments   Labs (all labs ordered are listed, but only abnormal results are displayed) Labs Reviewed - No data to display  EKG None  Radiology DG Knee Complete 4 Views Right  Result Date: 09/25/2019 CLINICAL DATA:  Fall injury EXAM: RIGHT KNEE - COMPLETE 4+ VIEW COMPARISON:  None. FINDINGS: No evidence of fracture, dislocation, or joint effusion. No evidence of arthropathy or other focal bone abnormality. Tiny enthesophytes seen at the superior patella. IMPRESSION: Negative. Electronically Signed   By: Jonna Clark M.D.   On: 09/25/2019 19:24    Procedures Procedures (including critical care time)  Medications Ordered in ED Medications  ketorolac (TORADOL) injection 60 mg (60 mg Intramuscular Not Given 09/25/19 2045)    ED Course  I have reviewed the triage vital signs and the nursing notes.  Pertinent labs & imaging results that were available during my care of the patient were reviewed by me and considered in my medical decision making (see chart for details).    MDM Rules/Calculators/A&P                         Patient with right knee pain after a fall.  Denies any head injury or LOC.  No tibial plateau tenderness.  Plain films without evidence of fracture. Neurovascularly intact. No evidence of gout, septic joint, cellulitis. Patient was offered Toradol here in the ED but refused.  Knee sleeve provided.  Encouraged her to take Tylenol/ibuprofen for pain.  Encouraged follow-up with PCP if symptoms  not improved.  Patient was requesting disability paperwork filled out, which I directed her back to her PCP.  Return precautions discussed.  She voiced understanding and is agreeable to this plan.  Final Clinical Impression(s) / ED Diagnoses Final diagnoses:  Acute pain of right knee    Rx / DC Orders ED Discharge Orders    None       Leone Brand 09/25/19 2112    Melene Plan, DO  09/25/19 2113  

## 2019-09-25 NOTE — Discharge Instructions (Signed)
You may take Tylenol or ibuprofen for pain.  Please wear the knee sleeve as needed.  Please follow-up with your primary care doctor if your symptoms not improve.  Return to the ER if your symptoms worsen.

## 2020-01-28 IMAGING — CR DG CHEST 2V
2 series · 2 of 2 positions shown · non-contrast
Comparison: 08/19/2014

CLINICAL DATA: Flu like symptoms, left-sided chest pain

EXAM:
CHEST - 2 VIEW

[w chest pa]
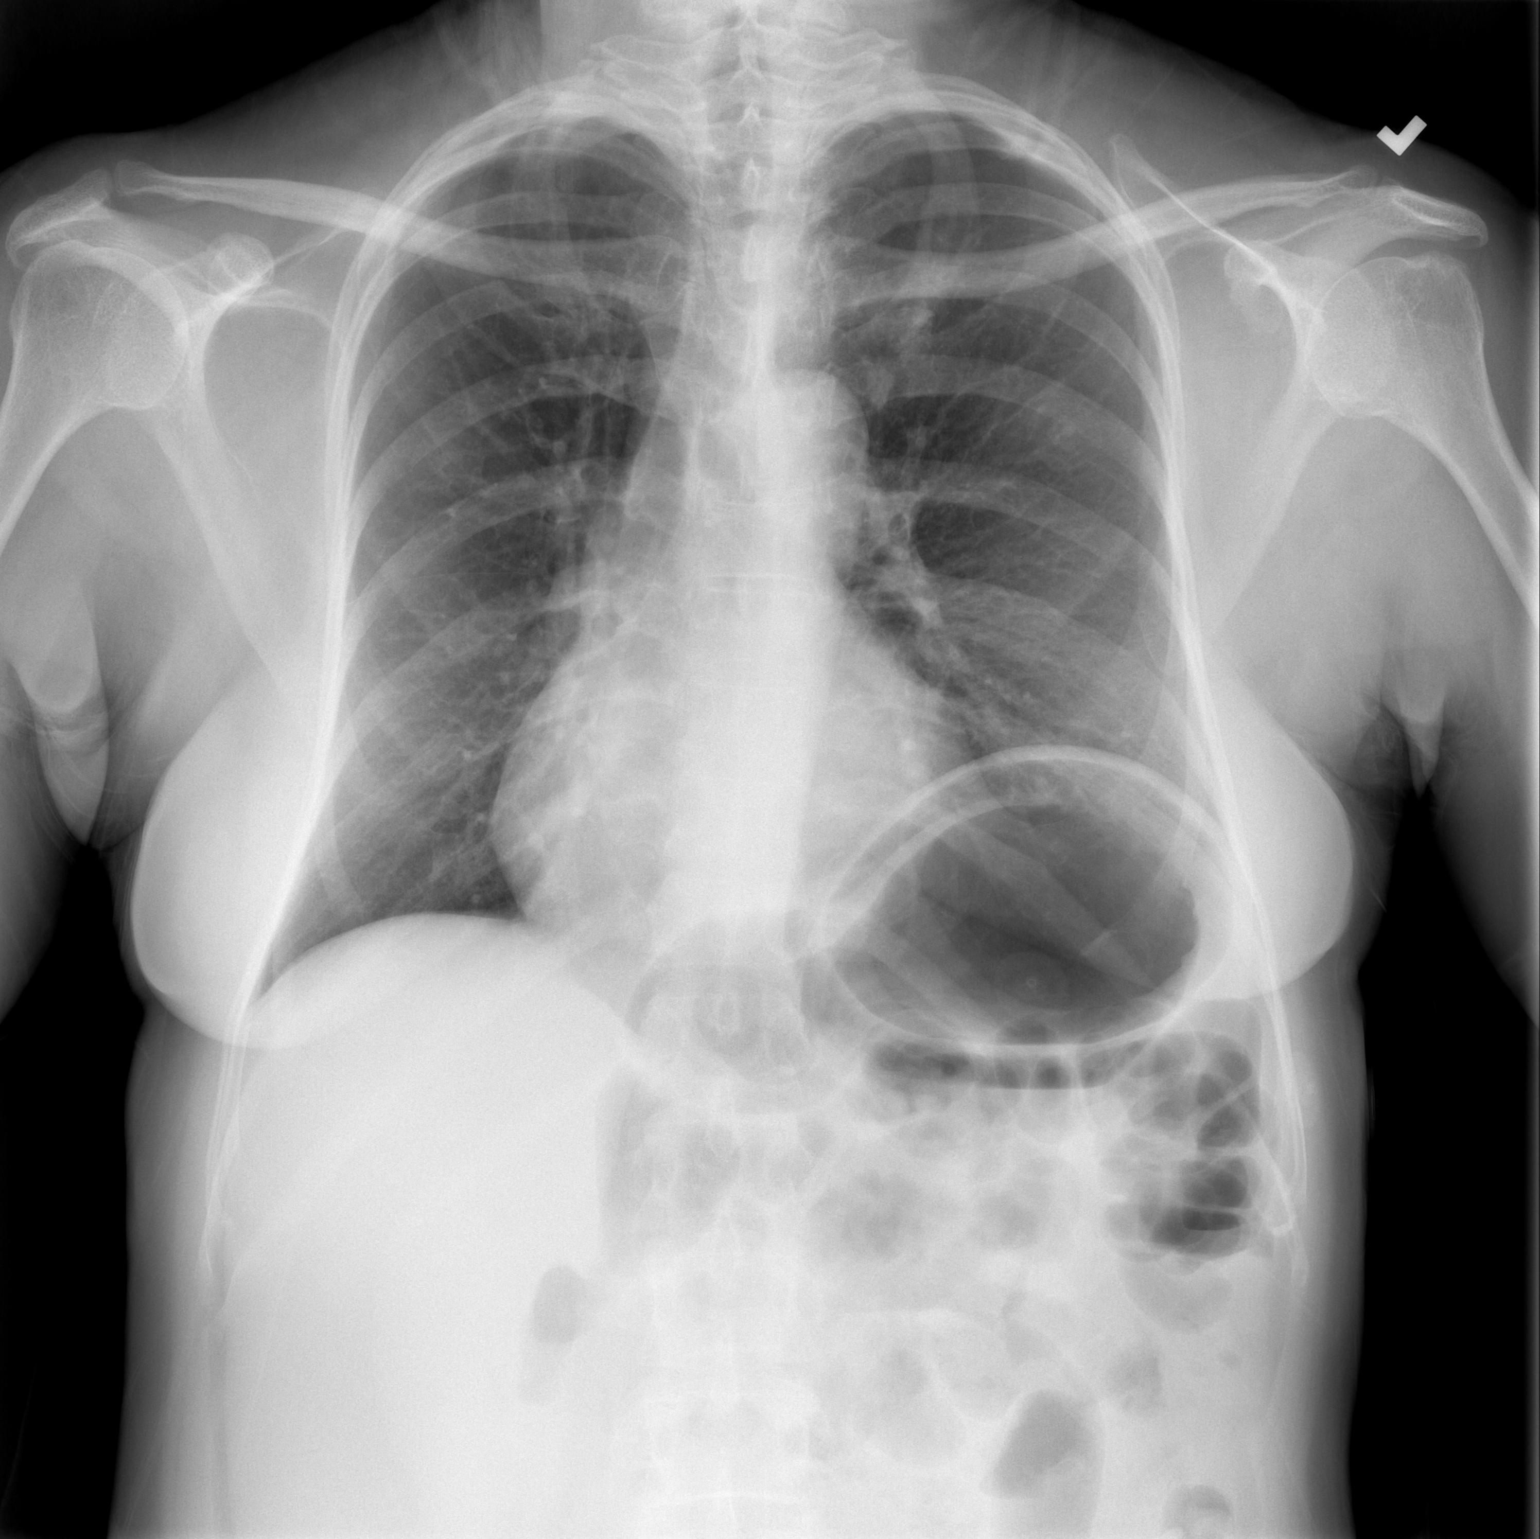

[w chest lat]
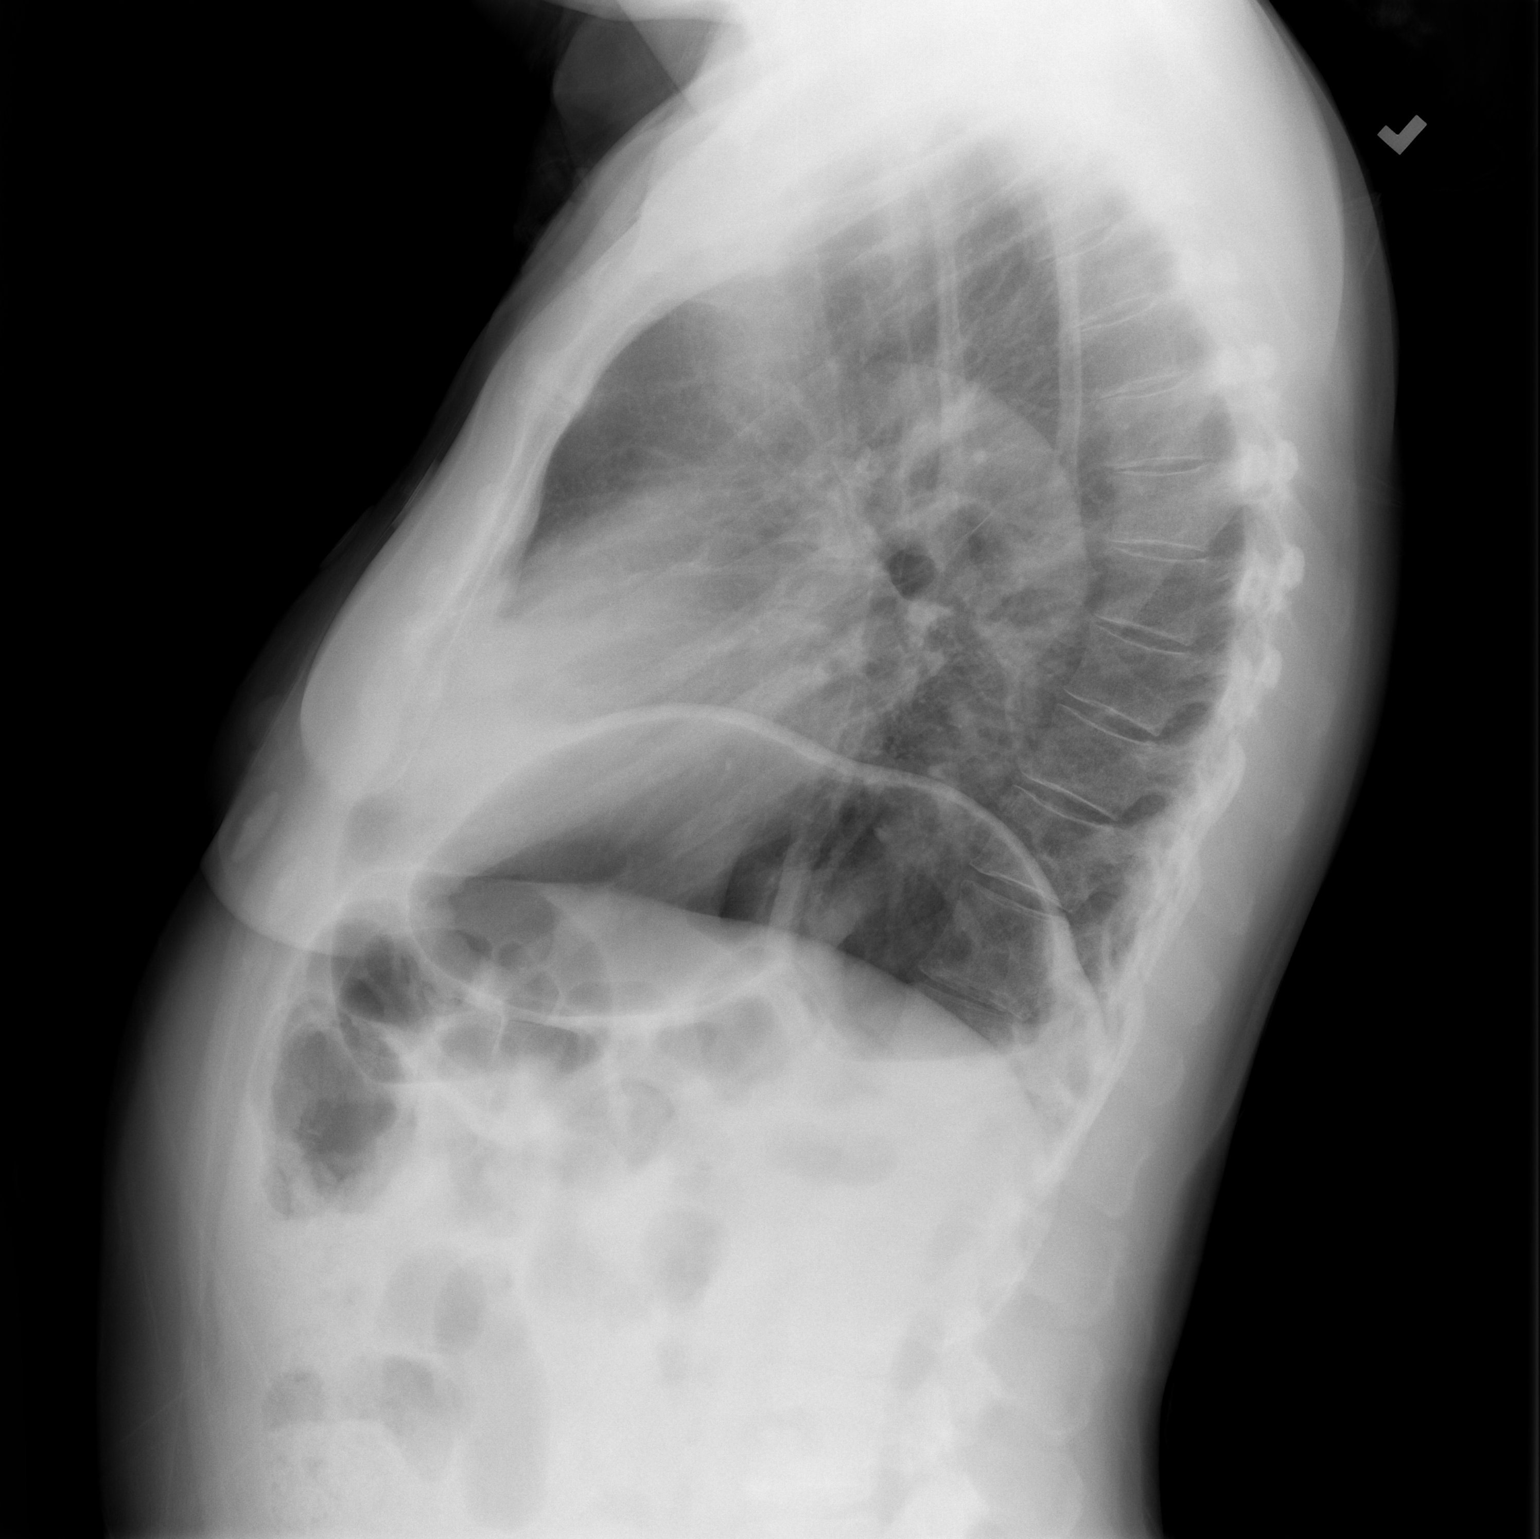

[2 of 2 positions shown; findings below may reference images not displayed]

FINDINGS: There is elevation of the left diaphragm. There is no focal
consolidation. There is no pleural effusion or pneumothorax. The
heart and mediastinal contours are unremarkable.

The osseous structures are unremarkable.
IMPRESSION: No active cardiopulmonary disease.

## 2021-06-13 ENCOUNTER — Other Ambulatory Visit: Payer: Self-pay

## 2021-06-13 ENCOUNTER — Emergency Department (HOSPITAL_BASED_OUTPATIENT_CLINIC_OR_DEPARTMENT_OTHER)
Admission: EM | Admit: 2021-06-13 | Discharge: 2021-06-13 | Disposition: A | Discharge status: Home / Self Care | Payer: Self-pay | Attending: Emergency Medicine | Admitting: Emergency Medicine

## 2021-06-13 ENCOUNTER — Encounter (HOSPITAL_BASED_OUTPATIENT_CLINIC_OR_DEPARTMENT_OTHER): Payer: Self-pay

## 2021-06-13 DIAGNOSIS — I10 Essential (primary) hypertension: Secondary | ICD-10-CM | POA: Insufficient documentation

## 2021-06-13 DIAGNOSIS — R519 Headache, unspecified: Secondary | ICD-10-CM | POA: Insufficient documentation

## 2021-06-13 DIAGNOSIS — Z79899 Other long term (current) drug therapy: Secondary | ICD-10-CM | POA: Insufficient documentation

## 2021-06-13 MED ORDER — ACETAMINOPHEN 325 MG PO TABS
650.0000 mg | ORAL_TABLET | Freq: Once | ORAL | Status: AC
  Administered 2021-06-13: 650 mg via ORAL
  Filled 2021-06-13: qty 2

## 2021-06-13 NOTE — ED Triage Notes (Signed)
Pt presents to ED from home C/O headache starting today. Pt concerned that her blood pressure is high. Reports BP 138/70 at home.

## 2021-06-13 NOTE — ED Notes (Signed)
Patient provided ginger ale and snacks at discharge.

## 2021-06-13 NOTE — Discharge Instructions (Addendum)
It was a pleasure taking care of you today!   Call your primary care doctor to set up a follow-up appointment regarding today's ED visit.  You may return to the emergency department for experiencing increasing/worsening headache, vision changes, trouble breathing, pain in the chest, worsening symptoms.

## 2021-06-13 NOTE — ED Provider Notes (Signed)
[facility] EMERGENCY DEPARTMENT Provider Note   CSN: [id number] Arrival date & time: 06/13/21  1923     History  Chief Complaint  Patient presents with   Headache    Cheryl Stephenson is a 61 y.o. female with a past medical history of hypertension who presents to the ED with concerns for headache onset today.  Also concerned that her blood pressure was high.  Her blood pressure at home was 138/70.  She has been compliant with her medications.  No meds tried prior to arrival to the ED.  Denies vision changes, chest pain, shortness of breath, nausea, vomiting, fever, chills.  The history is provided by the patient. No language interpreter was used.      Home Medications Prior to Admission medications   Medication Sig Start Date End Date Taking? Authorizing Provider  amLODipine (NORVASC) 10 MG tablet Take 5 mg by mouth daily.     [provider]  ATORVASTATIN CALCIUM PO Take by mouth.    [provider]  benzonatate (TESSALON) 100 MG capsule Take 1 capsule (100 mg total) by mouth every 8 (eight) hours. 10/29/17   Alvira Monday, MD  chlorhexidine (PERIDEX) 0.12 % solution Use as directed 15 mLs in the mouth or throat 2 (two) times daily. 10/22/17   Fayrene Helper, PA-C  fluticasone (FLONASE) 50 MCG/ACT nasal spray Place 2 sprays into both nostrils daily as needed for up to 7 days for allergies or rhinitis. 10/29/17 11/05/17  Alvira Monday, MD  hydrochlorothiazide (HYDRODIURIL) 25 MG tablet Take 25 mg by mouth daily.    [provider]  ibuprofen (ADVIL,MOTRIN) 600 MG tablet Take 1 tablet (600 mg total) by mouth every 6 (six) hours as needed. 10/22/17   Fayrene Helper, PA-C  levothyroxine (SYNTHROID, LEVOTHROID) 100 MCG tablet Take 100 mcg by mouth daily before breakfast.    [provider]  Multiple Vitamins-Minerals (MULTIVITAMIN WITH MINERALS) tablet Take 1 tablet by mouth daily.    [provider]  potassium chloride (K-DUR,KLOR-CON) 10  MEQ tablet Take 10 mEq by mouth 2 (two) times daily.    [provider]      Allergies    Patient has no known allergies.    Review of Systems   Review of Systems  Constitutional:  Negative for chills and fever.  Eyes:  Negative for visual disturbance.  Respiratory:  Negative for shortness of breath.   Cardiovascular:  Negative for chest pain.  Gastrointestinal:  Negative for nausea and vomiting.  Neurological:  Positive for headaches.  All other systems reviewed and are negative.  Physical Exam Updated Vital Signs BP 109/72 (BP Location: Left Arm)   Pulse 72   Temp 98.3 F (36.8 C) (Oral)   Resp 18   Ht 5\' 7"  (1.702 m)   Wt 70.3 kg   SpO2 100%   BMI 24.28 kg/m  Physical Exam Vitals and nursing note reviewed.  Constitutional:      General: She is not in acute distress.    Appearance: She is not diaphoretic.  HENT:     Head: Normocephalic and atraumatic.     Mouth/Throat:     Pharynx: No oropharyngeal exudate.  Eyes:     General: No scleral icterus.    Conjunctiva/sclera: Conjunctivae normal.  Cardiovascular:     Rate and Rhythm: Normal rate and regular rhythm.     Pulses: Normal pulses.     Heart sounds: Normal heart sounds.  Pulmonary:     Effort: Pulmonary effort is  normal. No respiratory distress.     Breath sounds: Normal breath sounds. No wheezing.  Abdominal:     General: Bowel sounds are normal.     Palpations: Abdomen is soft. There is no mass.     Tenderness: There is no abdominal tenderness. There is no guarding or rebound.  Musculoskeletal:        General: Normal range of motion.     Cervical back: Normal range of motion and neck supple.     Comments: Strength and sensation intact to bilateral upper and lower extremities.  Skin:    General: Skin is warm and dry.  Neurological:     Mental Status: She is alert.     Comments: Negative pronator drift.    Psychiatric:        Behavior: Behavior normal.    ED Results / Procedures /  Treatments   Labs (all labs ordered are listed, but only abnormal results are displayed) Labs Reviewed - No data to display  EKG None  Radiology No results found.  Procedures Procedures    Medications Ordered in ED Medications  acetaminophen (TYLENOL) tablet 650 mg (650 mg Oral Given 06/13/21 2326)    ED Course/ Medical Decision Making/ A&P                           Medical Decision Making Risk OTC drugs.   Pt presented to the ED with headache onset today.  Took her blood pressure at home and it was 138/70. History of hypertension and compliant with medications. Denies vision changes, chest pain, urinary symptoms.  Vital signs stable, patient afebrile, not hypotensive.  On exam patient without focal neurological deficits.  No acute cardiovascular or respiratory exam findings. Differential diagnosis includes SAH, ICH, migraine, tension headache.   Disposition: Presentation suspicious for tension headache. Presentation less likely due to [facility] or ICH due to the absence of red flags.  Doubt migraine headache.  After consideration of the diagnostic results and the patients response to treatment, I feel that the patient would benefit from Discharge home.  Patient able to tolerate p.o. intake prior to discharge.  Discussed with patient they may follow-up with their primary care provider as needed.  Supportive care measures and strict return precautions discussed with patient.  Patient knowledges and verbalized understanding.  Patient agreeable to discharge treatment plan. Patient appears safe for discharge at this time.  Follow-up as indicated in the discharge paperwork.   This chart was dictated using voice recognition software, Dragon. Despite the best efforts of this provider to proofread and correct errors, errors may still occur which can change documentation meaning.  Final Clinical Impression(s) / ED Diagnoses Final diagnoses:  Acute nonintractable headache, unspecified headache  type    Rx / DC Orders ED Discharge Orders     None         Perez Dirico A, PA-C 06/13/21 2349    Vanetta Mulders, MD 06/30/21 1529

## 2021-09-16 ENCOUNTER — Emergency Department (HOSPITAL_BASED_OUTPATIENT_CLINIC_OR_DEPARTMENT_OTHER)
Admission: EM | Admit: 2021-09-16 | Discharge: 2021-09-16 | Disposition: A | Discharge status: Home / Self Care | Payer: Self-pay | Source: Home / Self Care

## 2021-09-17 ENCOUNTER — Emergency Department (HOSPITAL_BASED_OUTPATIENT_CLINIC_OR_DEPARTMENT_OTHER)
Admission: EM | Admit: 2021-09-17 | Discharge: 2021-09-17 | Disposition: A | Discharge status: Home / Self Care | Payer: Self-pay | Attending: Emergency Medicine | Admitting: Emergency Medicine

## 2021-09-17 ENCOUNTER — Other Ambulatory Visit: Payer: Self-pay

## 2021-09-17 ENCOUNTER — Encounter (HOSPITAL_BASED_OUTPATIENT_CLINIC_OR_DEPARTMENT_OTHER): Payer: Self-pay | Admitting: Emergency Medicine

## 2021-09-17 DIAGNOSIS — R030 Elevated blood-pressure reading, without diagnosis of hypertension: Secondary | ICD-10-CM

## 2021-09-17 DIAGNOSIS — E039 Hypothyroidism, unspecified: Secondary | ICD-10-CM | POA: Insufficient documentation

## 2021-09-17 DIAGNOSIS — Z79899 Other long term (current) drug therapy: Secondary | ICD-10-CM | POA: Insufficient documentation

## 2021-09-17 DIAGNOSIS — I10 Essential (primary) hypertension: Secondary | ICD-10-CM | POA: Insufficient documentation

## 2021-09-17 DIAGNOSIS — F439 Reaction to severe stress, unspecified: Secondary | ICD-10-CM | POA: Insufficient documentation

## 2021-09-17 NOTE — ED Provider Notes (Signed)
[facility] Provider Note: Lowella Dell, MD, FACEP  CSN: [id number] MRN: [id number] ARRIVAL: 09/17/21 at 0115 ROOM: MH01/[facility]01   CHIEF COMPLAINT  BP concerns   HISTORY OF PRESENT ILLNESS  09/17/21 3:24 AM Cheryl Stephenson is a 61 y.o. female with a history of hypertension.  She had not taken her antihypertensives for several days and her BP was subsequently elevated.  She did take her atorvastatin and hydrochlorothiazide yesterday but is still concerned about her blood pressure.  She had a headache yesterday morning but it is improving and she now rates it as a 2 out of 10.  Her blood pressure on arrival is 120/94.  She admits to being under a lot of stress.  She denies chest pain, shortness of breath or visual changes.  She did have one "flutter" in her chest yesterday but this was brief and transient.   Past Medical History:  Diagnosis Date   Hypercholesteremia    Hypertension    Hypothyroid     Past Surgical History:  Procedure Laterality Date   ABDOMINAL HYSTERECTOMY     MYOMECTOMY      No family history on file.  Social History   Tobacco Use   Smoking status: Never   Smokeless tobacco: Never  Vaping Use   Vaping Use: Never used  Substance Use Topics   Alcohol use: No   Drug use: No    Prior to Admission medications   Medication Sig Start Date End Date Taking? Authorizing Provider  amLODipine (NORVASC) 10 MG tablet Take 5 mg by mouth daily.     [provider]  ATORVASTATIN CALCIUM PO Take by mouth.    [provider]  benzonatate (TESSALON) 100 MG capsule Take 1 capsule (100 mg total) by mouth every 8 (eight) hours. 10/29/17   Alvira Monday, MD  chlorhexidine (PERIDEX) 0.12 % solution Use as directed 15 mLs in the mouth or throat 2 (two) times daily. 10/22/17   Fayrene Helper, PA-C  fluticasone (FLONASE) 50 MCG/ACT nasal spray Place 2 sprays into both nostrils daily as needed for up to 7 days for allergies or rhinitis. 10/29/17 11/05/17   Alvira Monday, MD  hydrochlorothiazide (HYDRODIURIL) 25 MG tablet Take 25 mg by mouth daily.    [provider]  ibuprofen (ADVIL,MOTRIN) 600 MG tablet Take 1 tablet (600 mg total) by mouth every 6 (six) hours as needed. 10/22/17   Fayrene Helper, PA-C  levothyroxine (SYNTHROID, LEVOTHROID) 100 MCG tablet Take 100 mcg by mouth daily before breakfast.    [provider]  Multiple Vitamins-Minerals (MULTIVITAMIN WITH MINERALS) tablet Take 1 tablet by mouth daily.    [provider]  potassium chloride (K-DUR,KLOR-CON) 10 MEQ tablet Take 10 mEq by mouth 2 (two) times daily.    [provider]    Allergies Patient has no known allergies.   REVIEW OF SYSTEMS  Negative except as noted here or in the History of Present Illness.   PHYSICAL EXAMINATION  Initial Vital Signs Blood pressure (!) 120/94, pulse 81, temperature 98 F (36.7 C), resp. rate 18, height 5\' 7"  (1.702 m), weight 77.1 kg, SpO2 97 %.  Examination General: Well-developed, well-nourished female in no acute distress; appearance consistent with age of record HENT: normocephalic; atraumatic Eyes: pupils equal, round and reactive to light; extraocular muscles intact Neck: supple Heart: regular rate and rhythm Lungs: clear to auscultation bilaterally Abdomen: soft; nondistended; nontender; bowel sounds present Extremities: No deformity; full range of motion; pulses normal Neurologic: Awake, alert and  oriented; motor function intact in all extremities and symmetric; no facial droop Skin: Warm and dry Psychiatric: Normal mood and affect   RESULTS  Summary of this visit's results, reviewed and interpreted by myself:   EKG Interpretation  Date/Time:  Sunday September 17 2021 01:46:43 EDT Ventricular Rate:  70 PR Interval:  156 QRS Duration: 96 QT Interval:  412 QTC Calculation: 444 R Axis:   15 Text Interpretation: Normal sinus rhythm Nonspecific T wave abnormality Abnormal ECG No  significant change was found Confirmed by Paula Libra ([id number]) on 09/17/2021 3:33:14 AM       Laboratory Studies: No results found for this or any previous visit (from the past 24 hour(s)). Imaging Studies: No results found.  ED COURSE and MDM  Nursing notes, initial and subsequent vitals signs, including pulse oximetry, reviewed and interpreted by myself.  Vitals:   09/17/21 0126 09/17/21 0140  BP: (!) 120/94   Pulse: 81   Resp: 18   Temp: 98 F (36.7 C)   SpO2: 97%   Weight:  77.1 kg  Height:  5\' 7"  (1.702 m)   Medications - No data to display  The patient's blood pressure is not significantly elevated (SBP normal, DBP mildly elevated) and the patient is largely asymptomatic.  I do not believe additional testing is warranted.  The patient was reminded of the importance of daily compliance with antihypertensives.  PROCEDURES  Procedures   ED DIAGNOSES     ICD-10-CM   1. Elevated blood pressure reading  R03.0          Dejean Tribby, , MD 09/17/21 [phone]

## 2021-09-17 NOTE — ED Triage Notes (Signed)
Pt states she missed a few days of HTN meds and BP was elevated. She states she did take it Saturday am. She states she had a HA earlier but its eased considerably.

## 2022-06-26 ENCOUNTER — Emergency Department (HOSPITAL_BASED_OUTPATIENT_CLINIC_OR_DEPARTMENT_OTHER): Payer: Medicaid Other

## 2022-06-26 ENCOUNTER — Encounter (HOSPITAL_BASED_OUTPATIENT_CLINIC_OR_DEPARTMENT_OTHER): Payer: Self-pay

## 2022-06-26 ENCOUNTER — Emergency Department (HOSPITAL_BASED_OUTPATIENT_CLINIC_OR_DEPARTMENT_OTHER)
Admission: EM | Admit: 2022-06-26 | Discharge: 2022-06-26 | Disposition: A | Discharge status: Home / Self Care | Payer: Medicaid Other | Attending: Emergency Medicine | Admitting: Emergency Medicine

## 2022-06-26 DIAGNOSIS — Z1152 Encounter for screening for COVID-19: Secondary | ICD-10-CM | POA: Insufficient documentation

## 2022-06-26 DIAGNOSIS — Z79899 Other long term (current) drug therapy: Secondary | ICD-10-CM | POA: Diagnosis not present

## 2022-06-26 DIAGNOSIS — J069 Acute upper respiratory infection, unspecified: Secondary | ICD-10-CM | POA: Diagnosis not present

## 2022-06-26 DIAGNOSIS — E039 Hypothyroidism, unspecified: Secondary | ICD-10-CM | POA: Diagnosis not present

## 2022-06-26 DIAGNOSIS — Z79890 Hormone replacement therapy (postmenopausal): Secondary | ICD-10-CM | POA: Diagnosis not present

## 2022-06-26 DIAGNOSIS — I10 Essential (primary) hypertension: Secondary | ICD-10-CM | POA: Diagnosis not present

## 2022-06-26 DIAGNOSIS — R011 Cardiac murmur, unspecified: Secondary | ICD-10-CM | POA: Diagnosis not present

## 2022-06-26 DIAGNOSIS — R059 Cough, unspecified: Secondary | ICD-10-CM | POA: Diagnosis present

## 2022-06-26 LAB — RESP PANEL BY RT-PCR (RSV, FLU A&B, COVID)  RVPGX2
Influenza A by PCR: NEGATIVE
Influenza B by PCR: NEGATIVE
Resp Syncytial Virus by PCR: NEGATIVE
SARS Coronavirus 2 by RT PCR: NEGATIVE

## 2022-06-26 NOTE — ED Triage Notes (Signed)
Productive cough x 1 week. 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 Denies shortness of breath or fever.

## 2022-06-26 NOTE — ED Notes (Signed)
Patient transported to X-ray 

## 2022-06-26 NOTE — Discharge Instructions (Signed)
You were seen for your upper respiratory tract infection in the emergency department.   At home, please use Tylenol for your muscle aches and fevers.  Please use over-the-counter cough medication or tea with honey for your cough.  Follow-up with your primary doctor in 2-3 days regarding your visit.  This may be over the phone if you are feeling better or in person if you are feeling worse.  Please also talk to them about your murmur to see if you need any additional testing such as an echocardiogram.  Return immediately to the emergency department if you experience any of the following: Difficulty breathing, or any other concerning symptoms.    Thank you for visiting our Emergency Department. It was a pleasure taking care of you today.

## 2022-06-26 NOTE — ED Provider Notes (Signed)
[place] [facility] Provider Note   CSN: [id number] Arrival date & time: 06/26/22  7829     History  Chief Complaint  Patient presents with   Cough    Cheryl Stephenson is a 62 y.o. female.  62 year old female with a history of hypertension, hyperlipidemia, and thyroid goiter status post partial thyroidectomy and hypothyroidism who presents to the emergency department with cough and sore throat.  Patient reports that last Wednesday she started having a sore throat and started having congestion and a cough.  Says that she has been coughing up clear phlegm.  No fevers.  No chest pain.  No shortness of breath.  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  Has been taking Mucinex, OTC cough drops, and tea with honey for the cough.       Home Medications Prior to Admission medications   Medication Sig Start Date End Date Taking? Authorizing Provider  amLODipine (NORVASC) 10 MG tablet Take 5 mg by mouth daily.     [provider]  ATORVASTATIN CALCIUM PO Take by mouth.    [provider]  benzonatate (TESSALON) 100 MG capsule Take 1 capsule (100 mg total) by mouth every 8 (eight) hours. 10/29/17   Alvira Monday, MD  chlorhexidine (PERIDEX) 0.12 % solution Use as directed 15 mLs in the mouth or throat 2 (two) times daily. 10/22/17   Fayrene Helper, PA-C  fluticasone (FLONASE) 50 MCG/ACT nasal spray Place 2 sprays into both nostrils daily as needed for up to 7 days for allergies or rhinitis. 10/29/17 11/05/17  Alvira Monday, MD  hydrochlorothiazide (HYDRODIURIL) 25 MG tablet Take 25 mg by mouth daily.    [provider]  ibuprofen (ADVIL,MOTRIN) 600 MG tablet Take 1 tablet (600 mg total) by mouth every 6 (six) hours as needed. 10/22/17   Fayrene Helper, PA-C  levothyroxine  (SYNTHROID, LEVOTHROID) 100 MCG tablet Take 100 mcg by mouth daily before breakfast.    [provider]  Multiple Vitamins-Minerals (MULTIVITAMIN WITH MINERALS) tablet Take 1 tablet by mouth daily.    [provider]  potassium chloride (K-DUR,KLOR-CON) 10 MEQ tablet Take 10 mEq by mouth 2 (two) times daily.    [provider]      Allergies    Patient has no known allergies.    Review of Systems   Review of Systems  Physical Exam Updated Vital Signs BP (!) 143/91 (BP Location: Left Arm)   Pulse 92   Temp 99.2 F (37.3 C) (Oral)   Resp 18   Ht 5\' 7"  (1.702 m)   Wt 73.5 kg   SpO2 98%   BMI 25.37 kg/m  Physical Exam Vitals and nursing note reviewed.  Constitutional:      General: She is not in acute distress.    Appearance: She is well-developed.  HENT:     Head: Normocephalic and atraumatic.     Right Ear: External ear normal.     Left Ear: External ear normal.     Nose: Nose normal.  Eyes:     Extraocular Movements: Extraocular movements intact.     Conjunctiva/sclera: Conjunctivae normal.     Pupils: Pupils are equal, round, and reactive to light.  Cardiovascular:     Rate and Rhythm: Normal rate and regular rhythm.     Pulses: Normal  pulses.     Heart sounds: Murmur (At right base) heard.  Pulmonary:     Effort: Pulmonary effort is normal. No respiratory distress.     Breath sounds: Normal breath sounds.  Musculoskeletal:     Cervical back: Normal range of motion and neck supple.     Right lower leg: No edema.     Left lower leg: No edema.  Skin:    General: Skin is warm and dry.  Neurological:     Mental Status: She is alert and oriented to person, place, and time. Mental status is at baseline.  Psychiatric:        Mood and Affect: Mood normal.     ED Results / Procedures / Treatments   Labs (all labs ordered are listed, but only abnormal results are displayed) Labs Reviewed  RESP PANEL BY RT-PCR (RSV, FLU A&B, COVID)  [id number]     EKG None  Radiology DG Chest 2 View  Result Date: 06/26/2022 CLINICAL DATA:  Cough. EXAM: CHEST - 2 VIEW COMPARISON:  02/25/2018. FINDINGS: Unchanged elevation of the left hemidiaphragm with gaseous distention of the stomach and rightward mediastinal shift. Clear lungs. No pleural effusion or pneumothorax. Stable cardiac and mediastinal contours. IMPRESSION: 1.  No evidence of acute cardiopulmonary disease. 2. Unchanged elevation of the left hemidiaphragm with gaseous distention of the stomach. Electronically Signed   By: Orvan Falconer M.D.   On: 06/26/2022 08:02    Procedures Procedures    Medications Ordered in ED Medications - No data to display  ED Course/ Medical Decision Making/ A&P                             Medical Decision Making Amount and/or Complexity of Data Reviewed Radiology: ordered.   Cheryl Stephenson is a 62 y.o. female with comorbidities that complicate the patient evaluation including hypertension, hyperlipidemia, and thyroid goiter status post partial thyroidectomy with hypothyroidism who presents with URI symptoms  Initial Ddx:  URI, sinusitis, pneumonia, pharyngitis  MDM:  Feel the patient likely has a URI based on their symptoms.  With her age and persistent cough we will obtain a chest x-ray to rule out pneumonia though I feel this is less likely.  Considered sinusitis but not having significant sinus pain at this time so we will hold off on testing.  No signs of strep throat or severe pharyngitis at this time.  Plan:  COVID/flu Chest x-ray  ED Summary/Re-evaluation:  Patient reevaluated in the emergency department and was stable.  Satting well in the emergency departments did not develop any concerning symptoms.  Chest x-ray without evidence of pneumonia.  Feel that they are suitable for outpatient workup at this time so we will have them follow-up with their primary doctor in 2 to 3 days.  Informed that they should discuss their murmur with them as  well but no symptoms that would be concerning for severe aortic stenosis or anything else.  This patient presents to the ED for concern of complaints listed in HPI, this involves an extensive number of treatment options, and is a complaint that carries with it a high risk of complications and morbidity. Disposition including potential need for admission considered.   Dispo: DC Home. Return precautions discussed including, but not limited to, those listed in the AVS. Allowed pt time to ask questions which were answered fully prior to dc.  Additional history obtained from spouse Records reviewed Outpatient Clinic Notes I  independently reviewed the following imaging with scope of interpretation limited to determining acute life threatening conditions related to emergency care: Chest x-ray and agree with the radiologist interpretation with the following exceptions: None I personally reviewed and interpreted cardiac monitoring: normal sinus rhythm  I have reviewed the patients home medications and made adjustments as needed         Final Clinical Impression(s) / ED Diagnoses Final diagnoses:  Murmur  Viral URI with cough    Rx / DC Orders ED Discharge Orders     None         Rondel Baton, MD 06/26/22 [phone]

## 2022-07-29 ENCOUNTER — Emergency Department (HOSPITAL_BASED_OUTPATIENT_CLINIC_OR_DEPARTMENT_OTHER)
Admission: EM | Admit: 2022-07-29 | Discharge: 2022-07-29 | Discharge status: Against Medical Advice | Payer: Medicaid Other | Source: Home / Self Care

## 2022-07-29 ENCOUNTER — Encounter (HOSPITAL_BASED_OUTPATIENT_CLINIC_OR_DEPARTMENT_OTHER): Payer: Self-pay

## 2022-07-29 ENCOUNTER — Other Ambulatory Visit: Payer: Self-pay

## 2022-07-29 DIAGNOSIS — Z5321 Procedure and treatment not carried out due to patient leaving prior to being seen by health care provider: Secondary | ICD-10-CM | POA: Diagnosis not present

## 2022-07-29 DIAGNOSIS — R519 Headache, unspecified: Secondary | ICD-10-CM | POA: Diagnosis present

## 2022-07-29 NOTE — ED Triage Notes (Signed)
Patient has had a headache since yesterday.

## 2022-09-21 ENCOUNTER — Emergency Department (HOSPITAL_BASED_OUTPATIENT_CLINIC_OR_DEPARTMENT_OTHER): Payer: Medicaid Other

## 2022-09-21 ENCOUNTER — Emergency Department (HOSPITAL_BASED_OUTPATIENT_CLINIC_OR_DEPARTMENT_OTHER)
Admission: EM | Admit: 2022-09-21 | Discharge: 2022-09-21 | Disposition: A | Discharge status: Home / Self Care | Payer: Medicaid Other | Attending: Emergency Medicine | Admitting: Emergency Medicine

## 2022-09-21 ENCOUNTER — Encounter (HOSPITAL_BASED_OUTPATIENT_CLINIC_OR_DEPARTMENT_OTHER): Payer: Self-pay | Admitting: Emergency Medicine

## 2022-09-21 ENCOUNTER — Other Ambulatory Visit: Payer: Self-pay

## 2022-09-21 DIAGNOSIS — E039 Hypothyroidism, unspecified: Secondary | ICD-10-CM | POA: Insufficient documentation

## 2022-09-21 DIAGNOSIS — R079 Chest pain, unspecified: Secondary | ICD-10-CM | POA: Diagnosis present

## 2022-09-21 DIAGNOSIS — E876 Hypokalemia: Secondary | ICD-10-CM | POA: Diagnosis not present

## 2022-09-21 DIAGNOSIS — Z79899 Other long term (current) drug therapy: Secondary | ICD-10-CM | POA: Insufficient documentation

## 2022-09-21 DIAGNOSIS — I10 Essential (primary) hypertension: Secondary | ICD-10-CM | POA: Insufficient documentation

## 2022-09-21 DIAGNOSIS — Z79890 Hormone replacement therapy (postmenopausal): Secondary | ICD-10-CM | POA: Diagnosis not present

## 2022-09-21 LAB — BASIC METABOLIC PANEL
Anion gap: 7 (ref 5–15)
BUN: 22 mg/dL — ABNORMAL HIGH (ref 6–20)
CO2: 27 mmol/L (ref 22–32)
Calcium: 8.8 mg/dL — ABNORMAL LOW (ref 8.9–10.3)
Chloride: 103 mmol/L (ref 98–111)
Creatinine, Ser: 0.65 mg/dL (ref 0.44–1.00)
GFR, Estimated: >60 mL/min (ref >60)
Glucose, Bld: 100 mg/dL — ABNORMAL HIGH (ref 70–99)
Potassium: 2.9 mmol/L — ABNORMAL LOW (ref 3.5–5.1)
Sodium: 137 mmol/L (ref 135–145)

## 2022-09-21 LAB — TROPONIN I (HIGH SENSITIVITY)
Troponin I (High Sensitivity): 4 ng/L (ref <18)
Troponin I (High Sensitivity): 5 ng/L (ref <18)

## 2022-09-21 LAB — CBC
HCT: 31.9 % — ABNORMAL LOW (ref 36.0–46.0)
Hemoglobin: 10.1 g/dL — ABNORMAL LOW (ref 12.0–15.0)
MCH: 23.5 pg — ABNORMAL LOW (ref 26.0–34.0)
MCHC: 31.7 g/dL (ref 30.0–36.0)
MCV: 74.4 fL — ABNORMAL LOW (ref 80.0–100.0)
Platelets: 241 10*3/uL (ref 150–400)
RBC: 4.29 MIL/uL (ref 3.87–5.11)
RDW: 13.2 % (ref 11.5–15.5)
WBC: 5.5 10*3/uL (ref 4.0–10.5)
nRBC: 0 % (ref 0.0–0.2)

## 2022-09-21 LAB — MAGNESIUM: Magnesium: 1.9 mg/dL (ref 1.7–2.4)

## 2022-09-21 MED ORDER — FAMOTIDINE 20 MG PO TABS
20.0000 mg | ORAL_TABLET | Freq: Once | ORAL | Status: AC
  Administered 2022-09-21: 20 mg via ORAL
  Filled 2022-09-21: qty 1

## 2022-09-21 MED ORDER — POTASSIUM CHLORIDE CRYS ER 20 MEQ PO TBCR
40.0000 meq | EXTENDED_RELEASE_TABLET | Freq: Once | ORAL | Status: AC
  Administered 2022-09-21: 40 meq via ORAL
  Filled 2022-09-21: qty 2

## 2022-09-21 MED ORDER — ALUM & MAG HYDROXIDE-SIMETH 200-200-20 MG/5ML PO SUSP
15.0000 mL | Freq: Once | ORAL | Status: AC
  Administered 2022-09-21: 15 mL via ORAL
  Filled 2022-09-21: qty 30

## 2022-09-21 NOTE — ED Triage Notes (Signed)
Pt reports chest pain and left arm pain that started this morning. Denies SOB. Had a brief moment of nausea.

## 2022-09-21 NOTE — ED Provider Notes (Signed)
[place] [facility] Provider Note   CSN: [id number] Arrival date & time: 09/21/22  0102     History  Chief Complaint  Patient presents with   Chest Pain    Cheryl Stephenson is a 62 y.o. female.   Chest Pain   62 year old female presents emergency department with complaints of chest pain left arm pain.  Patient states that she woke this morning with some left-sided arm pain that then subsequently radiated to her left chest.  States that pain described as dull/ache.  Patient states that she may have "slept on my arm wrong" which may have caused pain.  States that she is currently asymptomatic.  Denies history of similar symptoms in the past.  Denies any shortness of breath during episode earlier.  Has had nothing to eat today so not related to consumption of food.  Patient denies any physical exertion today so unaware whether or not it is worsened by physical activity.  Denies any fever, chills, cough, congestion, abdominal pain, vomiting.  Does report 1 to 2-second episode of nausea during episode earlier.  States she did follow-up with cardiology back in 2019 but has not seen in since.  States she had stress test at that time which was "normal."  Past medical history significant for hypertension, hypothyroidism, hypercholesterolemia.  Denies tobacco use.  Home Medications Prior to Admission medications   Medication Sig Start Date End Date Taking? Authorizing Provider  amLODipine (NORVASC) 10 MG tablet Take 5 mg by mouth daily.     [provider]  ATORVASTATIN CALCIUM PO Take by mouth.    [provider]  benzonatate (TESSALON) 100 MG capsule Take 1 capsule (100 mg total) by mouth every 8 (eight) hours. 10/29/17   Alvira Monday, MD  chlorhexidine (PERIDEX) 0.12 % solution Use as directed 15 mLs in the mouth or throat 2 (two) times daily. 10/22/17   Fayrene Helper, PA-C  fluticasone (FLONASE) 50 MCG/ACT nasal spray Place 2 sprays into  both nostrils daily as needed for up to 7 days for allergies or rhinitis. 10/29/17 11/05/17  Alvira Monday, MD  hydrochlorothiazide (HYDRODIURIL) 25 MG tablet Take 25 mg by mouth daily.    [provider]  ibuprofen (ADVIL,MOTRIN) 600 MG tablet Take 1 tablet (600 mg total) by mouth every 6 (six) hours as needed. 10/22/17   Fayrene Helper, PA-C  levothyroxine (SYNTHROID, LEVOTHROID) 100 MCG tablet Take 100 mcg by mouth daily before breakfast.    [provider]  Multiple Vitamins-Minerals (MULTIVITAMIN WITH MINERALS) tablet Take 1 tablet by mouth daily.    [provider]  potassium chloride (K-DUR,KLOR-CON) 10 MEQ tablet Take 10 mEq by mouth 2 (two) times daily.    [provider]      Allergies    Patient has no known allergies.    Review of Systems   Review of Systems  Cardiovascular:  Positive for chest pain.  All other systems reviewed and are negative.   Physical Exam Updated Vital Signs BP (!) 133/92   Pulse 71   Temp 98.7 F (37.1 C) (Oral)   Resp 18   Ht 5\' 7"  (1.702 m)   Wt 77.7 kg   SpO2 99%   BMI 26.83 kg/m  Physical Exam Vitals and nursing note reviewed.  Constitutional:      General: She is not in acute distress.    Appearance: She is well-developed.  HENT:     Head: Normocephalic and atraumatic.  Eyes:  Conjunctiva/sclera: Conjunctivae normal.  Cardiovascular:     Rate and Rhythm: Normal rate and regular rhythm.     Pulses: Normal pulses.  Pulmonary:     Effort: Pulmonary effort is normal. No respiratory distress.     Breath sounds: Normal breath sounds. No wheezing, rhonchi or rales.  Chest:     Chest wall: No tenderness.  Abdominal:     Palpations: Abdomen is soft.     Tenderness: There is no abdominal tenderness.  Musculoskeletal:        General: No swelling.     Cervical back: Neck supple.     Right lower leg: No edema.     Left lower leg: No edema.  Skin:    General: Skin is warm and dry.     Capillary  Refill: Capillary refill takes less than 2 seconds.  Neurological:     Mental Status: She is alert.  Psychiatric:        Mood and Affect: Mood normal.     ED Results / Procedures / Treatments   Labs (all labs ordered are listed, but only abnormal results are displayed) Labs Reviewed  BASIC METABOLIC PANEL - Abnormal; Notable for the following components:      Result Value   Potassium 2.9 (*)    Glucose, Bld 100 (*)    BUN 22 (*)    Calcium 8.8 (*)    All other components within normal limits  CBC - Abnormal; Notable for the following components:   Hemoglobin 10.1 (*)    HCT 31.9 (*)    MCV 74.4 (*)    MCH 23.5 (*)    All other components within normal limits  MAGNESIUM  TROPONIN I (HIGH SENSITIVITY)  TROPONIN I (HIGH SENSITIVITY)    EKG None  Radiology DG Chest 2 View  Result Date: 09/21/2022 CLINICAL DATA:  Acute onset chest and left arm pain EXAM: CHEST - 2 VIEW COMPARISON:  Chest radiograph dated 06/26/2022 FINDINGS: Normal lung volumes. No focal consolidations. No pleural effusion or pneumothorax. The heart size and mediastinal contours are within normal limits. No acute osseous abnormality. IMPRESSION: No active cardiopulmonary disease. Electronically Signed   By: Agustin Cree M.D.   On: 09/21/2022 09:17    Procedures Procedures    Medications Ordered in ED Medications  famotidine (PEPCID) tablet 20 mg (20 mg Oral Given 09/21/22 0925)  alum & mag hydroxide-simeth (MAALOX/MYLANTA) 200-200-20 MG/5ML suspension 15 mL (15 mLs Oral Given 09/21/22 0925)  potassium chloride SA (KLOR-CON M) CR tablet 40 mEq (40 mEq Oral Given 09/21/22 9604)    ED Course/ Medical Decision Making/ A&P                                 Medical Decision Making Amount and/or Complexity of Data Reviewed Labs: ordered. Radiology: ordered.  Risk OTC drugs. Prescription drug management.   This patient presents to the ED for concern of chest pain, this involves an extensive number of  treatment options, and is a complaint that carries with it a high risk of complications and morbidity.  The differential diagnosis includes ACS, PE, pneumonia, pneumothorax, pericarditis/myocarditis/tamponade, aortic dissection, aortic aneurysm,   Co morbidities that complicate the patient evaluation  See HPI   Additional history obtained:  Additional history obtained from EMR External records from outside source obtained and reviewed including hospital records   Lab Tests:  I Ordered, and personally interpreted labs.  The pertinent results  include: No leukocytosis.  Patient with evidence of anemia with a hemoglobin of 10.10 which is microcytic in nature and near patient's baseline.  Hypokalemia of 2.9 as well as hypocalcemia of 8.8 but otherwise, lites lites within normal limits.  BUN isolated to the elevated of 22 with normal renal function.  Initial troponin of 4 with repeat 5   Imaging Studies ordered:  I ordered imaging studies including chest x-ray I independently visualized and interpreted imaging which showed no acute cardiopulmonary process I agree with the radiologist interpretation   Cardiac Monitoring: / EKG:  The patient was maintained on a cardiac monitor.  I personally viewed and interpreted the cardiac monitored which showed an underlying rhythm of: Sinus rhythm with evidence of LVH   Consultations Obtained:  N/a   Problem List / ED Course / Critical interventions / Medication management  Chest pain I ordered medication including Maalox, Pepcid, potassium chloride   Reevaluation of the patient after these medicines showed that the patient improved I have reviewed the patients home medicines and have made adjustments as needed   Social Determinants of Health:  Denies tobacco, illicit drug use   Test / Admission - Considered:  Chest pain Vitals signs within normal range and stable throughout visit. Laboratory/imaging studies significant for: See  above 62 year old female presents emergency department with complaints of chest pain as well as left arm pain.  Patient symptoms began feeling left arm pain when she woke up and subsequently went to her chest before spontaneous resolution.  Symptoms seem to not be related to consumption of food with atypical location of pain for GERD; low suspicion for reflux.  Symptoms seem to not be exertional in nature as she woke with this chest pain minutes without shortness of breath.  Patient without chest pain rating to back, pulse deficits, neurologic deficits, significant hypertension; low suspicion for aortic dissection.  Patient with delta negative troponin, lack of acute ischemic changes on EKG; low suspicion for ACS.  Patient without pleuritic chest pain, risk factors for DVT/PE, tachycardia, tachypnea, hypoxia; patient with Pesi score of 60; low suspicion for PE.  Patient without evidence of volume overload, pulmonary vascular congestion/pleural effusion on chest x-ray imaging; low suspicion for CHF.  Symptoms could be secondary to musculoskeletal etiology given pain experienced when she woke up from laying on affected side and noticed relief with time elapsed from sleeping inside position.  Patient is with risk factors of both hypercholesterolemia as well as hypertension so recommend follow-up with cardiology in the outpatient setting as she has not had evaluation since 2019 for further assessment.  Treatment plan discussed at length with patient and she acknowledged understanding was agreeable to said plan.  Patient overall well-appearing, afebrile in no acute distress. Worrisome signs and symptoms were discussed with the patient, and the patient acknowledged understanding to return to the ED if noticed. Patient was stable upon discharge.          Final Clinical Impression(s) / ED Diagnoses Final diagnoses:  Chest pain, unspecified type    Rx / DC Orders ED Discharge Orders     None          Peter Garter, [place] 09/21/22 1442    Alvira Monday, MD 09/21/22 2228

## 2022-09-21 NOTE — Discharge Instructions (Signed)
As discussed, your workup today was overall reassuring.  Chest x-ray did not show signs of pneumonia or other abnormality.  Your heart enzymes were normal.  EKG appeared normal.  Will recommend close follow-up with your cardiologist in the outpatient setting for reevaluation of your symptoms.  Please do not hesitate to return if you develop chest pain again or develop any other worrisome signs and symptoms we discussed.

## 2023-07-18 ENCOUNTER — Other Ambulatory Visit: Payer: Self-pay

## 2023-07-18 ENCOUNTER — Encounter (HOSPITAL_BASED_OUTPATIENT_CLINIC_OR_DEPARTMENT_OTHER): Payer: Self-pay | Admitting: Emergency Medicine

## 2023-07-18 ENCOUNTER — Emergency Department (HOSPITAL_BASED_OUTPATIENT_CLINIC_OR_DEPARTMENT_OTHER): Admission: EM | Admit: 2023-07-18 | Discharge: 2023-07-18 | Disposition: A | Discharge status: Home / Self Care

## 2023-07-18 ENCOUNTER — Emergency Department (HOSPITAL_BASED_OUTPATIENT_CLINIC_OR_DEPARTMENT_OTHER)

## 2023-07-18 DIAGNOSIS — R059 Cough, unspecified: Secondary | ICD-10-CM | POA: Diagnosis present

## 2023-07-18 DIAGNOSIS — E039 Hypothyroidism, unspecified: Secondary | ICD-10-CM | POA: Diagnosis not present

## 2023-07-18 DIAGNOSIS — I10 Essential (primary) hypertension: Secondary | ICD-10-CM | POA: Insufficient documentation

## 2023-07-18 DIAGNOSIS — B349 Viral infection, unspecified: Secondary | ICD-10-CM | POA: Insufficient documentation

## 2023-07-18 DIAGNOSIS — Z79899 Other long term (current) drug therapy: Secondary | ICD-10-CM | POA: Insufficient documentation

## 2023-07-18 LAB — RESP PANEL BY RT-PCR (RSV, FLU A&B, COVID)  RVPGX2
Influenza A by PCR: NEGATIVE
Influenza B by PCR: NEGATIVE
Resp Syncytial Virus by PCR: NEGATIVE
SARS Coronavirus 2 by RT PCR: NEGATIVE

## 2023-07-18 MED ORDER — FLUTICASONE PROPIONATE 50 MCG/ACT NA SUSP: 2.0000 | Freq: Every day | NASAL | 2 refills | Status: AC

## 2023-07-18 MED ORDER — BENZONATATE 100 MG PO CAPS
100.0000 mg | ORAL_CAPSULE | Freq: Three times a day (TID) | ORAL | 0 refills | Status: AC
Start: 2023-07-18 — End: ?

## 2023-07-18 NOTE — ED Notes (Addendum)
Patient refused EKG at this time. MD aware

## 2023-07-18 NOTE — ED Triage Notes (Signed)
 Productive cough and sinus congestion x 2  days. Pain with coughing . No shortness of breath

## 2023-07-18 NOTE — ED Provider Notes (Addendum)
 [place] [facility] Provider Note   CSN: [id number] Arrival date & time: 07/18/23  9182     Patient presents with: Cough   Cheryl Stephenson is a 63 y.o. female.   63 year old female with past medical history of hypertension and hyperlipidemia as well as hypothyroidism presenting to the emergency department today with nasal congestion, frontal and maxillary sinus tenderness, minimal cough, and some mild chest discomfort with coughing this morning.  The patient states that she started feeling unwell yesterday.  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  She has not had any vomiting with this.  She reports that she does have issues with chronic constipation and states that her last bowel movements 3 to 4 days ago.  This is not out of the ordinary for her.  She is passing flatus and denies any significant abdominal pain.   Cough      Prior to Admission medications   Medication Sig Start Date End Date Taking? Authorizing Provider  benzonatate  (TESSALON ) 100 MG capsule Take 1 capsule (100 mg total) by mouth every 8 (eight) hours. 07/18/23  Yes Ula Prentice SAUNDERS, MD  fluticasone  (FLONASE ) 50 MCG/ACT nasal spray Place 2 sprays into both nostrils daily. 07/18/23  Yes Ula Prentice SAUNDERS, MD  amLODipine (NORVASC) 10 MG tablet Take 5 mg by mouth daily.     [provider]  ATORVASTATIN CALCIUM PO Take by mouth.    [provider]  benzonatate  (TESSALON ) 100 MG capsule Take 1 capsule (100 mg total) by mouth every 8 (eight) hours. 10/29/17   Dreama Longs, MD  chlorhexidine  (PERIDEX ) 0.12 % solution Use as directed 15 mLs in the mouth or throat 2 (two) times daily. 10/22/17   Nivia Colon, PA-C  fluticasone  (FLONASE ) 50 MCG/ACT nasal spray Place 2 sprays into both nostrils daily as needed for up to 7 days for allergies or rhinitis. 10/29/17 11/05/17  Dreama Longs, MD  hydrochlorothiazide  (HYDRODIURIL) 25 MG tablet Take 25 mg by mouth daily.    [provider]  ibuprofen  (ADVIL ,MOTRIN ) 600 MG tablet Take 1 tablet (600 mg total) by mouth every 6 (six) hours as needed. 10/22/17   Nivia Colon, PA-C  levothyroxine (SYNTHROID, LEVOTHROID) 100 MCG tablet Take 100 mcg by mouth daily before breakfast.    [provider]  Multiple Vitamins-Minerals (MULTIVITAMIN WITH MINERALS) tablet Take 1 tablet by mouth daily.    [provider]  potassium chloride  (K-DUR,KLOR-CON ) 10 MEQ tablet Take 10 mEq by mouth 2 (two) times daily.    [provider]    Allergies: Patient has no known allergies.    Review of Systems  HENT:  Positive for congestion and sinus pressure.   Respiratory:  Positive for cough.   All other systems reviewed and are negative.   Updated Vital Signs BP (!) 139/95 (BP Location: Right Arm)   Pulse 86   Temp 98.2 F (36.8 C) (Oral)   Resp 16   Ht 5' 7 (1.702 m)   Wt 68.5 kg   SpO2 100%   BMI 23.65 kg/m   Physical Exam Vitals and nursing note reviewed.   Gen: NAD Eyes: PERRL, EOMI HEENT: no oropharyngeal swelling, bilateral maxillary and frontal sinus tenderness noted Neck: trachea midline Resp: clear to auscultation bilaterally Card: RRR, no murmurs, rubs, or gallops Abd: nontender, nondistended Extremities: no calf tenderness, no edema Vascular: 2+ radial pulses bilaterally, 2+ DP pulses bilaterally Skin:  no rashes Psyc: acting appropriately   (all labs ordered are listed, but only abnormal results are displayed) Labs Reviewed  RESP PANEL BY RT-PCR (RSV, FLU A&B, COVID)  [id number]    EKG: EKG Interpretation Date/Time:  Thursday July 18 2023 08:41:03 EDT Ventricular Rate:  76 PR Interval:  162 QRS Duration:  98 QT Interval:  430 QTC Calculation: 484 R Axis:   13  Text Interpretation: Sinus rhythm Abnormal R-wave progression, early transition Left ventricular hypertrophy Confirmed by Ula Barter [phone]) on  07/18/2023 9:29:03 AM  Radiology: [facility] Chest Port 1 View Result Date: 07/18/2023 CLINICAL DATA:  Productive cough and sinus congestion x2 days. EXAM: PORTABLE CHEST 1 VIEW COMPARISON:  July 05, 2023 FINDINGS: The heart size and mediastinal contours are within normal limits. There is mild, stable elevation of the left hemidiaphragm. Both lungs are clear. The visualized skeletal structures are unremarkable. IMPRESSION: No active cardiopulmonary disease. Electronically Signed   By: Suzen Dials M.D.   On: 07/18/2023 09:40     Procedures   Medications Ordered in the ED - No data to display                                  Medical Decision Making 63 year old female with past medical history of hypertension hyperlipidemia presenting to the emergency department today with symptoms most consistent with likely viral syndrome.  The patient's abdominal exam is reassuring.  Initially labs are ordered for the patient but she declines these.  Her symptoms are very consistent with viral syndrome.  I think that if her x-ray and EKG do not show any acute findings that symptomatic treatment would be in order.  She does not have any abdominal tenderness on exam and states that she does have issues with chronic constipation so do not think that further emergent workup is warranted at this time.  She does not have exam or symptoms consistent with obstruction, appendicitis, diverticulitis, or other emergent pathology at this time.  I will reevaluate for ultimate disposition.  The patient's chest x-ray is unremarkable.  Her EKG does not show any acute changes compared to her previous EKG in our system.  Viral testing is pending at the time of discharge as this would not really change her acute management at this time and she does not have significant comorbidities.  Amount and/or Complexity of Data Reviewed Radiology: ordered.  Risk Prescription drug management.        Final diagnoses:  Viral syndrome     ED Discharge Orders          Ordered    fluticasone  (FLONASE ) 50 MCG/ACT nasal spray  Daily        07/18/23 0946    benzonatate  (TESSALON ) 100 MG capsule  Every 8 hours        07/18/23 0946               Ula Barter SAUNDERS, MD 07/18/23 9051    Ula Barter SAUNDERS, MD 07/18/23 [id number]

## 2023-07-18 NOTE — Discharge Instructions (Signed)
 Start the Flonase  which should help with the nasal congestion.  You may also use the Tessalon  as needed for cough if this worsens.  I think that your symptoms are likely from a viral illness.  You may check on MyChart for the viral testing.  You may try a fiber supplement such as Metamucil or Colace/MiraLAX for your ongoing issues with the constipation.  Please follow-up with your doctor regarding this and return to the ER for worsening symptoms.

## 2023-07-23 ENCOUNTER — Telehealth (HOSPITAL_COMMUNITY): Payer: Self-pay

## 2023-07-23 NOTE — Telephone Encounter (Signed)
 Attempted to contact pt.  No answer, HIPPA compliant message left for a return call.  Pt. Left a VM for a return call.

## 2023-10-01 ENCOUNTER — Other Ambulatory Visit: Payer: Self-pay

## 2023-10-01 ENCOUNTER — Encounter (HOSPITAL_BASED_OUTPATIENT_CLINIC_OR_DEPARTMENT_OTHER): Payer: Self-pay

## 2023-10-01 ENCOUNTER — Emergency Department (HOSPITAL_BASED_OUTPATIENT_CLINIC_OR_DEPARTMENT_OTHER)
Admission: EM | Admit: 2023-10-01 | Discharge: 2023-10-01 | Disposition: A | Discharge status: Home / Self Care | Attending: Emergency Medicine | Admitting: Emergency Medicine

## 2023-10-01 DIAGNOSIS — G44209 Tension-type headache, unspecified, not intractable: Secondary | ICD-10-CM | POA: Insufficient documentation

## 2023-10-01 DIAGNOSIS — Z760 Encounter for issue of repeat prescription: Secondary | ICD-10-CM | POA: Insufficient documentation

## 2023-10-01 DIAGNOSIS — Z79899 Other long term (current) drug therapy: Secondary | ICD-10-CM | POA: Diagnosis not present

## 2023-10-01 DIAGNOSIS — E039 Hypothyroidism, unspecified: Secondary | ICD-10-CM | POA: Insufficient documentation

## 2023-10-01 DIAGNOSIS — I10 Essential (primary) hypertension: Secondary | ICD-10-CM | POA: Insufficient documentation

## 2023-10-01 MED ORDER — HYDROCHLOROTHIAZIDE 25 MG PO TABS: 25.0000 mg | ORAL_TABLET | Freq: Every day | ORAL | 0 refills | Status: AC

## 2023-10-01 MED ORDER — HYDROCHLOROTHIAZIDE 25 MG PO TABS
25.0000 mg | ORAL_TABLET | Freq: Once | ORAL | Status: AC
  Administered 2023-10-01: 25 mg via ORAL
  Filled 2023-10-01: qty 1

## 2023-10-01 MED ORDER — ACETAMINOPHEN 500 MG PO TABS
1000.0000 mg | ORAL_TABLET | Freq: Once | ORAL | Status: AC
  Administered 2023-10-01: 1000 mg via ORAL
  Filled 2023-10-01: qty 2

## 2023-10-01 NOTE — ED Provider Notes (Signed)
 [place] [facility] Provider Note   CSN: [id number] Arrival date & time: 10/01/23  9247     Patient presents with: Medication Refill   Cheryl Stephenson is a 63 y.o. female with PMHx HLD, HTN, hypothyroidism who presents to ED concerned for medications given to her by her pharmacy. Patient stating that she ran out of her BP medications, so she went to the pharmacy to get these refilled. Patient stating that she eventually realized that her hydrochlorothiazide  bottle had a different patient's name on the label and Percocet 10mg  was also written on this incorrect patient label. Patient showing me a picture of the bottle of hydrochlorothiazide  with this incorrect printed label on the bottle. Patient thinking that she might have taken this possibly incorrect medication for 2-4 days last week - once daily. Patient endorsing one episode of dizziness and nausea last week which has since resolved. Patient also endorsing constipation recently but last BM was yesterday - non bloody. Patient also concerned for mild frontal headache - has not taken any OTC medications for this recently.  Patient stating that her other medications have been filled properly and she has been taking those once daily as prescribed. Patient has not taken any of the possible incorrect pills since last week.     Medication Refill      Prior to Admission medications   Medication Sig Start Date End Date Taking? Authorizing Provider  amLODipine (NORVASC) 10 MG tablet Take 5 mg by mouth daily.     [provider]  ATORVASTATIN CALCIUM PO Take by mouth.    [provider]  benzonatate  (TESSALON ) 100 MG capsule Take 1 capsule (100 mg total) by mouth every 8 (eight) hours. 10/29/17   Dreama Longs, MD  benzonatate  (TESSALON ) 100 MG capsule Take 1 capsule (100 mg total) by mouth every 8 (eight) hours. 07/18/23   Ula Prentice SAUNDERS, MD  chlorhexidine  (PERIDEX ) 0.12 % solution Use as  directed 15 mLs in the mouth or throat 2 (two) times daily. 10/22/17   Nivia Colon, PA-C  fluticasone  (FLONASE ) 50 MCG/ACT nasal spray Place 2 sprays into both nostrils daily as needed for up to 7 days for allergies or rhinitis. 10/29/17 11/05/17  Dreama Longs, MD  fluticasone  (FLONASE ) 50 MCG/ACT nasal spray Place 2 sprays into both nostrils daily. 07/18/23   Ula Prentice SAUNDERS, MD  hydrochlorothiazide  (HYDRODIURIL ) 25 MG tablet Take 1 tablet (25 mg total) by mouth daily. 10/01/23   Hoy Nidia FALCON, PA-C  ibuprofen  (ADVIL ,MOTRIN ) 600 MG tablet Take 1 tablet (600 mg total) by mouth every 6 (six) hours as needed. 10/22/17   Nivia Colon, PA-C  levothyroxine (SYNTHROID, LEVOTHROID) 100 MCG tablet Take 100 mcg by mouth daily before breakfast.    [provider]  Multiple Vitamins-Minerals (MULTIVITAMIN WITH MINERALS) tablet Take 1 tablet by mouth daily.    [provider]  potassium chloride  (K-DUR,KLOR-CON ) 10 MEQ tablet Take 10 mEq by mouth 2 (two) times daily.    [provider]    Allergies: Patient has no known allergies.    Review of Systems  Neurological:  Positive for headaches.    Updated Vital Signs BP 123/82   Pulse 69   Temp 98.3 F (36.8 C) (Oral)   Resp 17   Ht 5' 7 (1.702 m)   Wt 71.2 kg   SpO2 98%   BMI 24.59 kg/m   Physical Exam Vitals and nursing note reviewed.  Constitutional:      General:  She is not in acute distress.    Appearance: She is not ill-appearing or toxic-appearing.  HENT:     Head: Normocephalic and atraumatic.     Mouth/Throat:     Mouth: Mucous membranes are moist.  Eyes:     General: No scleral icterus.       Right eye: No discharge.        Left eye: No discharge.     Conjunctiva/sclera: Conjunctivae normal.  Cardiovascular:     Rate and Rhythm: Normal rate and regular rhythm.     Pulses: Normal pulses.     Heart sounds: Normal heart sounds. No murmur heard. Pulmonary:     Effort: Pulmonary effort is normal. No  respiratory distress.     Breath sounds: Normal breath sounds. No wheezing, rhonchi or rales.  Abdominal:     General: Abdomen is flat. Bowel sounds are normal.     Palpations: Abdomen is soft.  Musculoskeletal:     Right lower leg: No edema.     Left lower leg: No edema.  Skin:    General: Skin is warm and dry.     Findings: No rash.  Neurological:     General: No focal deficit present.     Mental Status: She is alert and oriented to person, place, and time. Mental status is at baseline.     Comments: GCS 15. Speech is goal oriented. No deficits appreciated to CN III-XII; symmetric eyebrow raise, no facial drooping, tongue midline. Patient has equal grip strength bilaterally with 5/5 strength against resistance in all major muscle groups bilaterally. Sensation to light touch intact. Patient moves extremities without ataxia.  Psychiatric:        Mood and Affect: Mood normal.        Behavior: Behavior normal.     (all labs ordered are listed, but only abnormal results are displayed) Labs Reviewed - No data to display  EKG: None  Radiology: No results found.   Procedures   Medications Ordered in the ED  hydrochlorothiazide  (HYDRODIURIL ) tablet 25 mg (25 mg Oral Given 10/01/23 0942)  acetaminophen  (TYLENOL ) tablet 1,000 mg (1,000 mg Oral Given 10/01/23 0942)                                    Medical Decision Making Risk OTC drugs. Prescription drug management.   This patient presents to the ED for concern of wrong medications, this involves an extensive number of treatment options, and is a complaint that carries with it a high risk of complications and morbidity.  The differential diagnosis includes overdose, allergic reaction, etc.   Co morbidities that complicate the patient evaluation  HLD, HTN, hypothyroidism    Additional history obtained:  Dr. Claudene PCP   Problem List / ED Course / Critical interventions / Medication management  Patient presents to ED  concern for possible incorrect prescription given to her last week.  Patient showed me a picture of that HCTZ bottle that does have an incorrect sticker on top.  Patient has had intermittent symptoms over the week, but is currently only complaining of a mild frontal headache.  Patient has not taken any medicine for this headache.  Physical/neuroexam reassuring.  Patient afebrile with stable vitals. Provided patient with her correct blood pressure medicine and with a dose of Tylenol .  Patient now feeling better and is ready to go home.  I will provide patient with a  new prescription, however I educated her that it might be better for her to give her complaint to the pharmacist that they can fix this possible error for free.  Patient verbalized understanding of plan.  Patient also to follow-up with PCP. I have reviewed the patients home medicines and have made adjustments as needed The patient has been appropriately medically screened and/or stabilized in the ED. I have low suspicion for any other emergent medical condition which would require further screening, evaluation or treatment in the ED or require inpatient management. At time of discharge the patient is hemodynamically stable and in no acute distress. I have discussed work-up results and diagnosis with patient and answered all questions. Patient is agreeable with discharge plan. We discussed strict return precautions for returning to the emergency department and they verbalized understanding.     Social Determinants of Health:  none      Final diagnoses:  Primary hypertension  Prescription refill  Tension headache    ED Discharge Orders          Ordered    hydrochlorothiazide  (HYDRODIURIL ) 25 MG tablet  Daily        10/01/23 1056               Hoy Nidia FALCON, [place] 10/01/23 1114    Armenta Canning, MD 10/01/23 1502

## 2023-10-01 NOTE — Discharge Instructions (Addendum)
 You will need to follow-up with your primary care provider.  Seek emergency care if experiencing any new or worsening symptoms.

## 2023-10-01 NOTE — ED Triage Notes (Addendum)
 Patient had ran out of antihypertensive, picked up her prescription last week and took it a few days, the bottle had someone elses label on it saying oxycodone, however the bottle was still labeled hydrochlorothiazide . [place] it looks like her typical pills, but unsure if she's been taking the correct meds. Requesting refill on her bp med because she doesn't want to take what she has in case of error.  C/o headaches, constipation. Last took the meds about a week ago.

## 2023-11-06 ENCOUNTER — Emergency Department (HOSPITAL_BASED_OUTPATIENT_CLINIC_OR_DEPARTMENT_OTHER)

## 2023-11-06 ENCOUNTER — Other Ambulatory Visit: Payer: Self-pay

## 2023-11-06 ENCOUNTER — Emergency Department (HOSPITAL_BASED_OUTPATIENT_CLINIC_OR_DEPARTMENT_OTHER)
Admission: EM | Admit: 2023-11-06 | Discharge: 2023-11-06 | Disposition: A | Discharge status: Home / Self Care | Attending: Emergency Medicine | Admitting: Emergency Medicine

## 2023-11-06 ENCOUNTER — Encounter (HOSPITAL_BASED_OUTPATIENT_CLINIC_OR_DEPARTMENT_OTHER): Payer: Self-pay | Admitting: Emergency Medicine

## 2023-11-06 DIAGNOSIS — W19XXXA Unspecified fall, initial encounter: Secondary | ICD-10-CM

## 2023-11-06 DIAGNOSIS — W010XXA Fall on same level from slipping, tripping and stumbling without subsequent striking against object, initial encounter: Secondary | ICD-10-CM | POA: Insufficient documentation

## 2023-11-06 DIAGNOSIS — S8002XA Contusion of left knee, initial encounter: Secondary | ICD-10-CM | POA: Insufficient documentation

## 2023-11-06 DIAGNOSIS — S8000XA Contusion of unspecified knee, initial encounter: Secondary | ICD-10-CM

## 2023-11-06 DIAGNOSIS — S8001XA Contusion of right knee, initial encounter: Secondary | ICD-10-CM | POA: Diagnosis not present

## 2023-11-06 DIAGNOSIS — S8991XA Unspecified injury of right lower leg, initial encounter: Secondary | ICD-10-CM | POA: Diagnosis present

## 2023-11-06 NOTE — ED Triage Notes (Signed)
 [place] 2 days ago , tripped and landed forward on cement ground . Bilateral knee pain , right arm pain , right side and back pain . Ambulatory with steady gait .

## 2023-11-06 NOTE — ED Provider Notes (Signed)
 [place] [facility] Provider Note   CSN: [id number] Arrival date & time: 11/06/23  1800     Patient presents with: Cheryl Stephenson   Cheryl Stephenson is a 63 y.o. female.  Who presents to the ED for injuries after fall.  Patient reports tripping and falling while taking out the garbage 2 days ago.  She fell forward onto cement.  No facial trauma or head trauma but landed on both knees and both hands.  Since then has had pain in both knees as well as in the right foot.  She was actually wearing gloves at the time which she wears frequently but did not suffer any injuries to her hands.  Has been able to walk.    Fall       Prior to Admission medications   Medication Sig Start Date End Date Taking? Authorizing Provider  amLODipine (NORVASC) 10 MG tablet Take 5 mg by mouth daily.     [provider]  ATORVASTATIN CALCIUM PO Take by mouth.    [provider]  benzonatate  (TESSALON ) 100 MG capsule Take 1 capsule (100 mg total) by mouth every 8 (eight) hours. 10/29/17   Dreama Longs, MD  benzonatate  (TESSALON ) 100 MG capsule Take 1 capsule (100 mg total) by mouth every 8 (eight) hours. 07/18/23   Ula Prentice SAUNDERS, MD  chlorhexidine  (PERIDEX ) 0.12 % solution Use as directed 15 mLs in the mouth or throat 2 (two) times daily. 10/22/17   Nivia Colon, PA-C  fluticasone  (FLONASE ) 50 MCG/ACT nasal spray Place 2 sprays into both nostrils daily as needed for up to 7 days for allergies or rhinitis. 10/29/17 11/05/17  Dreama Longs, MD  fluticasone  (FLONASE ) 50 MCG/ACT nasal spray Place 2 sprays into both nostrils daily. 07/18/23   Ula Prentice SAUNDERS, MD  hydrochlorothiazide  (HYDRODIURIL ) 25 MG tablet Take 1 tablet (25 mg total) by mouth daily. 10/01/23   Hoy Nidia FALCON, PA-C  ibuprofen  (ADVIL ,MOTRIN ) 600 MG tablet Take 1 tablet (600 mg total) by mouth every 6 (six) hours as needed. 10/22/17   Nivia Colon, PA-C  levothyroxine (SYNTHROID, LEVOTHROID) 100 MCG  tablet Take 100 mcg by mouth daily before breakfast.    [provider]  Multiple Vitamins-Minerals (MULTIVITAMIN WITH MINERALS) tablet Take 1 tablet by mouth daily.    [provider]  potassium chloride  (K-DUR,KLOR-CON ) 10 MEQ tablet Take 10 mEq by mouth 2 (two) times daily.    [provider]    Allergies: Patient has no known allergies.    Review of Systems  Updated Vital Signs BP 119/79 (BP Location: Left Arm)   Pulse 79   Temp 98 F (36.7 C) (Oral)   Resp 16   Wt 68.5 kg   SpO2 100%   BMI 23.65 kg/m   Physical Exam Vitals and nursing note reviewed.  HENT:     Head: Normocephalic and atraumatic.  Eyes:     Pupils: Pupils are equal, round, and reactive to light.  Cardiovascular:     Rate and Rhythm: Normal rate and regular rhythm.  Pulmonary:     Effort: Pulmonary effort is normal.     Breath sounds: Normal breath sounds.  Abdominal:     Palpations: Abdomen is soft.     Tenderness: There is no abdominal tenderness.  Musculoskeletal:     Comments: Tenderness and mild swelling over both anterior knees No crepitus no deformity Some tenderness over dorsal aspect of the right foot 2+ DP pulses bilaterally Sensation intact light  touch throughout No tenderness of hands or wrists  Skin:    General: Skin is warm and dry.  Neurological:     Mental Status: She is alert.  Psychiatric:        Mood and Affect: Mood normal.     (all labs ordered are listed, but only abnormal results are displayed) Labs Reviewed - No data to display  EKG: None  Radiology: DG Knee Complete 4 Views Right Result Date: 11/06/2023 EXAM: 4 VIEW(S) XRAY OF THE RIGHT KNEE 11/06/2023 10:26:09 PM COMPARISON: None available. CLINICAL HISTORY: fall pain FINDINGS: BONES AND JOINTS: No acute fracture. No focal osseous lesion. No joint dislocation. No significant joint effusion. Mild degenerative changes, most prominent in the medial and patellofemoral compartments. SOFT  TISSUES: The soft tissues are unremarkable. IMPRESSION: 1. No acute fracture or dislocation. Electronically signed by: Pinkie Pebbles MD 11/06/2023 10:39 PM EDT RP Workstation: [id number]   DG Knee Complete 4 Views Left Result Date: 11/06/2023 EXAM: 4 VIEW(S) XRAY OF THE LEFT KNEE 11/06/2023 10:32:22 PM COMPARISON: None available. CLINICAL HISTORY: fall painn FINDINGS: BONES AND JOINTS: No acute fracture. No focal osseous lesion. No joint dislocation. Possible small suprapatellar effusion. Mild degenerative changes, most prominent in the medial and patellofemoral compartments. SOFT TISSUES: The soft tissues are unremarkable. IMPRESSION: 1. No fracture or dislocation. 2. Possible small suprapatellar effusion. Electronically signed by: Pinkie Pebbles MD 11/06/2023 10:37 PM EDT RP Workstation: [id number]   DG Foot Complete Right Result Date: 11/06/2023 EXAM: 3 VIEW(S) XRAY OF THE FOOT 11/06/2023 10:26:09 PM COMPARISON: None available. CLINICAL HISTORY: fall pain FINDINGS: BONES AND JOINTS: No acute fracture. No focal osseous lesion. No joint dislocation. Mild hallux valgus deformity. Plantar calcaneal spur. Severe osteoarthritis of the first MTP. SOFT TISSUES: Dorsal forefoot soft tissue swelling. IMPRESSION: 1. No acute fracture or dislocation. 2. Dorsal forefoot soft tissue swelling. Electronically signed by: Pinkie Pebbles MD 11/06/2023 10:34 PM EDT RP Workstation: [id number]     Procedures   Medications Ordered in the ED - No data to display                                  Medical Decision Making 63 year old female here for injuries after fall 2 days ago.  Tripped and landed on both knees and both hands.  Hands and wrists are okay.  Does have some tenderness over both knees and over the right foot.  X-rays show no osseous injury or dislocation.  Instructed for symptomatic management of likely contusions.  Stable for discharge  Amount and/or Complexity of Data Reviewed Radiology:  ordered.        Final diagnoses:  Contusion of knee, unspecified laterality, initial encounter  Fall, initial encounter    ED Discharge Orders     None          Pamella Ozell LABOR, DO 11/06/23 2321

## 2023-11-06 NOTE — Discharge Instructions (Addendum)
 You were seen emergency room for injuries after fall 2 days ago The x-rays of both knees and your right foot looked okay with no broken bones or dislocations Take Tylenol  or Motrin  for pain Return to work tomorrow Follow-up with your PCP within 1 week Return to the emergency department for severe pain or any other concerns

## 2024-02-11 ENCOUNTER — Encounter (HOSPITAL_BASED_OUTPATIENT_CLINIC_OR_DEPARTMENT_OTHER): Payer: Self-pay

## 2024-02-11 ENCOUNTER — Other Ambulatory Visit: Payer: Self-pay

## 2024-02-11 ENCOUNTER — Emergency Department (HOSPITAL_BASED_OUTPATIENT_CLINIC_OR_DEPARTMENT_OTHER): Admission: EM | Admit: 2024-02-11 | Discharge: 2024-02-11 | Disposition: A | Discharge status: Home / Self Care

## 2024-02-11 DIAGNOSIS — E039 Hypothyroidism, unspecified: Secondary | ICD-10-CM | POA: Diagnosis not present

## 2024-02-11 DIAGNOSIS — R110 Nausea: Secondary | ICD-10-CM | POA: Insufficient documentation

## 2024-02-11 DIAGNOSIS — I10 Essential (primary) hypertension: Secondary | ICD-10-CM | POA: Insufficient documentation

## 2024-02-11 DIAGNOSIS — Z79899 Other long term (current) drug therapy: Secondary | ICD-10-CM | POA: Insufficient documentation

## 2024-02-11 LAB — CBC
HCT: 32.1 % — ABNORMAL LOW (ref 36.0–46.0)
Hemoglobin: 10.2 g/dL — ABNORMAL LOW (ref 12.0–15.0)
MCH: 23.8 pg — ABNORMAL LOW (ref 26.0–34.0)
MCHC: 31.8 g/dL (ref 30.0–36.0)
MCV: 75 fL — ABNORMAL LOW (ref 80.0–100.0)
Platelets: 250 K/uL (ref 150–400)
RBC: 4.28 MIL/uL (ref 3.87–5.11)
RDW: 13.2 % (ref 11.5–15.5)
WBC: 6.1 K/uL (ref 4.0–10.5)
nRBC: 0 % (ref 0.0–0.2)

## 2024-02-11 LAB — COMPREHENSIVE METABOLIC PANEL WITH GFR
ALT: 15 U/L (ref 0–44)
AST: 18 U/L (ref 15–41)
Albumin: 4.3 g/dL (ref 3.5–5.0)
Alkaline Phosphatase: 78 U/L (ref 38–126)
Anion gap: 11 (ref 5–15)
BUN: 18 mg/dL (ref 8–23)
CO2: 27 mmol/L (ref 22–32)
Calcium: 9.4 mg/dL (ref 8.9–10.3)
Chloride: 101 mmol/L (ref 98–111)
Creatinine, Ser: 0.71 mg/dL (ref 0.44–1.00)
GFR, Estimated: >60 mL/min
Glucose, Bld: 104 mg/dL — ABNORMAL HIGH (ref 70–99)
Potassium: 3.6 mmol/L (ref 3.5–5.1)
Sodium: 138 mmol/L (ref 135–145)
Total Bilirubin: 0.8 mg/dL (ref 0.0–1.2)
Total Protein: 8 g/dL (ref 6.5–8.1)

## 2024-02-11 LAB — URINALYSIS, ROUTINE W REFLEX MICROSCOPIC
Bilirubin Urine: NEGATIVE
Glucose, UA: NEGATIVE mg/dL
Hgb urine dipstick: NEGATIVE
Ketones, ur: NEGATIVE mg/dL
Leukocytes,Ua: NEGATIVE
Nitrite: NEGATIVE
Protein, ur: NEGATIVE mg/dL
Specific Gravity, Urine: 1.025 (ref 1.005–1.030)
pH: 6.5 (ref 5.0–8.0)

## 2024-02-11 LAB — LIPASE, BLOOD: Lipase: 14 U/L (ref 11–51)

## 2024-02-11 MED ORDER — ONDANSETRON 4 MG PO TBDP: 4.0000 mg | ORAL_TABLET | Freq: Three times a day (TID) | ORAL | 0 refills | Status: AC | PRN

## 2024-02-11 MED ORDER — ONDANSETRON 4 MG PO TBDP
4.0000 mg | ORAL_TABLET | Freq: Once | ORAL | Status: AC
  Administered 2024-02-11: 4 mg via ORAL
  Filled 2024-02-11: qty 1

## 2024-02-11 NOTE — ED Provider Notes (Addendum)
 " [facility] Provider Note   CSN: [id number] Arrival date & time: 02/11/24  9263     Patient presents with: Nausea   Cheryl Stephenson is a 64 y.o. female.   This is a 64 year old female presenting emergency department for nausea.  Has been in her usual state of health.  Had some fried chicken last night.  Awoke this morning with nausea.  Not having abdominal pain.  Did not throw up.  Had a bowel movement yesterday, passing flatus.  Has had myomectomy/hysterectomy in oophorectomy, no other abdominal surgeries per patient's report.  She is not having lightheadedness, dizziness, chest pain, shortness of breath.  No dysuria or frequency.  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 Authorizing Provider  ondansetron  (ZOFRAN -ODT) 4 MG disintegrating tablet Take 1 tablet (4 mg total) by mouth every 8 (eight) hours as needed for nausea or vomiting. 02/11/24  Yes Neysa Clap J, DO  amLODipine (NORVASC) 10 MG tablet Take 5 mg by mouth daily.     [provider]  ATORVASTATIN CALCIUM PO Take by mouth.    [provider]  benzonatate  (TESSALON ) 100 MG capsule Take 1 capsule (100 mg total) by mouth every 8 (eight) hours. 10/29/17   Dreama Longs, MD  benzonatate  (TESSALON ) 100 MG capsule Take 1 capsule (100 mg total) by mouth every 8 (eight) hours. 07/18/23   Ula Prentice SAUNDERS, MD  chlorhexidine  (PERIDEX ) 0.12 % solution Use as directed 15 mLs in the mouth or throat 2 (two) times daily. 10/22/17   Nivia Colon, PA-C  fluticasone  (FLONASE ) 50 MCG/ACT nasal spray Place 2 sprays into both nostrils daily as needed for up to 7 days for allergies or rhinitis. 10/29/17 11/05/17  Dreama Longs, MD  fluticasone  (FLONASE ) 50 MCG/ACT nasal spray Place 2 sprays into both nostrils daily. 07/18/23   Ula Prentice SAUNDERS, MD   hydrochlorothiazide  (HYDRODIURIL ) 25 MG tablet Take 1 tablet (25 mg total) by mouth daily. 10/01/23   Hoy Nidia FALCON, PA-C  ibuprofen  (ADVIL ,MOTRIN ) 600 MG tablet Take 1 tablet (600 mg total) by mouth every 6 (six) hours as needed. 10/22/17   Nivia Colon, PA-C  levothyroxine (SYNTHROID, LEVOTHROID) 100 MCG tablet Take 100 mcg by mouth daily before breakfast.    [provider]  Multiple Vitamins-Minerals (MULTIVITAMIN WITH MINERALS) tablet Take 1 tablet by mouth daily.    [provider]  potassium chloride  (K-DUR,KLOR-CON ) 10 MEQ tablet Take 10 mEq by mouth 2 (two) times daily.    [provider]    Allergies: Patient has no known allergies.    Review of Systems  Updated Vital Signs BP (!) 143/102   Pulse 84   Temp 98.8 F (37.1 C) (Oral)   Resp 18   SpO2 98%   Physical Exam Vitals and nursing note reviewed.  Constitutional:      General: She is not in acute distress.    Appearance: She is not toxic-appearing.  HENT:     Head: Normocephalic.     Nose: Nose normal.  Eyes:     Conjunctiva/sclera: Conjunctivae normal.  Cardiovascular:     Rate and Rhythm: Normal rate.  Pulmonary:     Effort: Pulmonary effort is normal.     Breath sounds: Normal breath sounds.  Abdominal:     General: Abdomen is  flat. There is no distension.     Tenderness: There is no abdominal tenderness. There is no guarding or rebound.  Musculoskeletal:        General: Normal range of motion.  Skin:    General: Skin is warm.     Capillary Refill: Capillary refill takes less than 2 seconds.  Neurological:     General: No focal deficit present.     Mental Status: She is alert.  Psychiatric:        Mood and Affect: Mood normal.        Behavior: Behavior normal.     (all labs ordered are listed, but only abnormal results are displayed) Labs Reviewed  CBC - Abnormal; Notable for the following components:      Result Value   Hemoglobin 10.2 (*)    HCT 32.1 (*)    MCV  75.0 (*)    MCH 23.8 (*)    All other components within normal limits  COMPREHENSIVE METABOLIC PANEL WITH GFR - Abnormal; Notable for the following components:   Glucose, Bld 104 (*)    All other components within normal limits  LIPASE, BLOOD  URINALYSIS, ROUTINE W REFLEX MICROSCOPIC  CBG MONITORING, ED    EKG: EKG Interpretation Date/Time:  Tuesday February 11 2024 08:10:27 EST Ventricular Rate:  61 PR Interval:  160 QRS Duration:  104 QT Interval:  420 QTC Calculation: 423 R Axis:   18  Text Interpretation: Sinus rhythm Abnormal R-wave progression, early transition Left ventricular hypertrophy Confirmed by Neysa Clap [phone]) on 02/11/2024 8:14:40 AM  Radiology: No results found.   Procedures   Medications Ordered in the ED  ondansetron  (ZOFRAN -ODT) disintegrating tablet 4 mg (4 mg Oral Given 02/11/24 0817)    Clinical Course as of 02/11/24 1400  Tue Feb 11, 2024  0814 Urinalysis, Routine w reflex microscopic -Urine, Clean Catch Not consistent with UTI [TY]  0814 EKG 12-Lead On my interpretation; EKG appears to be sinus rhythm 61 bpm.  Normal intervals.  No ischemic changes.  No QTc prolongation. [TY]  [id number] Comprehensive metabolic panel(!) No metabolic abnormalities.  Normal kidney function.  No transaminitis or elevated bilirubin to suggest a hepatobiliary etiology for nausea.  Also, soft nontender abdomen. [TY]  0855 Lipase: 14 Pancreatitis unlikely cause of her nausea [TY]  0856 CBC(!) Stable anemia compared to prior [TY]  0856 Urinalysis, Routine w reflex microscopic -Urine, Clean Catch Without evidence of urinary tract infection. [TY]  [id number] Workup overall reassuring.  Given stable vital signs, benign abdominal exam and reassuring labs will discharge with supportive/expectant management.  Strict return precautions given.  Stable for discharge. [TY]    Clinical Course User Index [TY] Neysa Clap PARAS, DO                                 Medical Decision  Making This is a 64 year old female past medical history significant for hypertension hypothyroid hyperlipidemia presenting emergency department for nausea.  She is afebrile nontachycardic, slightly hypertensive.  Maintaining oxygen saturation on room air.  Does not appear to be in distress, has a soft nontender abdomen on exam.  Viral etiology?  However, symptoms did start after having fried chicken last night perhaps some component of food poisoning versus hepatobiliary dysfunction.  Will get screening labs, EKG give Zofran  and reevaluate.  Amount and/or Complexity of Data Reviewed External Data Reviewed:     Details: Had negative stress test 2019.  No recent imaging of her abdomen on record Labs: ordered. Decision-making details documented in ED Course. Radiology:     Details: Considered CT abdomen, however with patient's complaint of only nausea with normal vitals, and reassuring abdominal exam makes acute surgical pathology unlikely.  Will plan for labs, if significant abnormalities or intolerant of p.o. intake may reconsider ECG/medicine tests: ordered and independent interpretation performed. Decision-making details documented in ED Course.  Risk Prescription drug management. Decision regarding hospitalization. Diagnosis or treatment significantly limited by social determinants of health. Risk Details: Poor health literacy       Final diagnoses:  Nausea    ED Discharge Orders          Ordered    ondansetron  (ZOFRAN -ODT) 4 MG disintegrating tablet  Every 8 hours PRN        02/11/24 0857               Neysa Caron PARAS, DO 02/11/24 0806    Neysa Caron PARAS, DO 02/11/24 1400    Neysa Caron PARAS, DO 02/11/24 1400  "

## 2024-02-11 NOTE — Discharge Instructions (Signed)
 You are lab work was all reassuring.  I suspect your nausea is due to a virus of some kind.  Treatment is supportive.  We are prescribing nausea medications that she can take as needed.  Please follow-up with your primary doctor.  Return immediately if develop fevers, chills, lightheadedness, passout, severe headache, chest pain, shortness of breath, palpitations, severe abdominal pain, stop having bowel movements, uncontrolled nausea with vomiting and inability to tolerate liquids.  Or, please return if develop any new or worsening symptoms that are concerning to you.

## 2024-02-11 NOTE — ED Notes (Signed)
 Patient refuse CBG check, RN in room and notified.

## 2024-02-11 NOTE — ED Triage Notes (Signed)
 Pt c.o nausea that started this morning. Denies pain.   No abdominal pain or other symptoms.

## 2024-02-11 NOTE — ED Notes (Signed)
